# Patient Record
Sex: Female | Born: 1940 | Race: Black or African American | Hispanic: No | Smoking: Former smoker
Health system: Southern US, Community
[De-identification: ages and names within clinical notes are randomized; demographics above are authoritative.]

## PROBLEM LIST (undated history)

## (undated) DIAGNOSIS — I639 Cerebral infarction, unspecified: Secondary | ICD-10-CM

## (undated) DIAGNOSIS — I10 Essential (primary) hypertension: Secondary | ICD-10-CM

## (undated) DIAGNOSIS — H409 Unspecified glaucoma: Secondary | ICD-10-CM

## (undated) HISTORY — PX: CHOLECYSTECTOMY: SHX55

---

## 2004-08-27 ENCOUNTER — Ambulatory Visit: Payer: Self-pay | Admitting: Family Medicine

## 2004-09-03 ENCOUNTER — Ambulatory Visit: Payer: Self-pay | Admitting: Family Medicine

## 2005-10-07 ENCOUNTER — Ambulatory Visit: Payer: Self-pay | Admitting: Family Medicine

## 2007-10-15 ENCOUNTER — Ambulatory Visit: Payer: Self-pay

## 2008-07-11 ENCOUNTER — Ambulatory Visit: Payer: Self-pay

## 2008-09-13 ENCOUNTER — Ambulatory Visit: Payer: Self-pay

## 2008-11-07 ENCOUNTER — Ambulatory Visit: Payer: Self-pay

## 2008-11-29 ENCOUNTER — Ambulatory Visit: Payer: Self-pay

## 2008-12-05 ENCOUNTER — Ambulatory Visit: Payer: Self-pay

## 2008-12-12 ENCOUNTER — Ambulatory Visit: Payer: Self-pay

## 2010-01-01 ENCOUNTER — Ambulatory Visit: Payer: Self-pay | Admitting: Nurse Practitioner

## 2011-01-04 IMAGING — US US EXTREM LOW VENOUS BILAT
1 series · 17 of 24 positions shown · non-contrast
Comparison: none

REASON FOR EXAM: CALLREPORT [id number]  bilateral swelling and pitting
edema  eval DVT
COMMENTS:

[Series 1: us extrem low venous bilat · 17 of 54 slices shown]
[im 1/54]
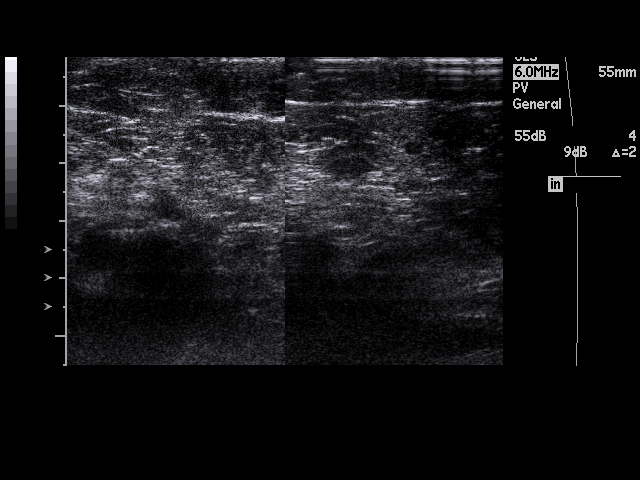
[im 5/54]
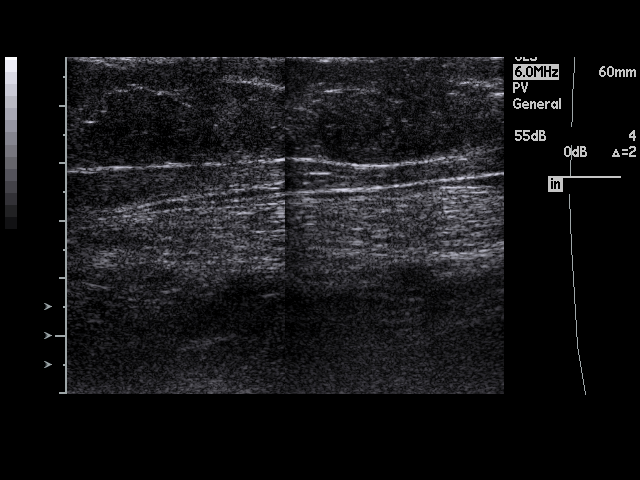
[im 7/54]
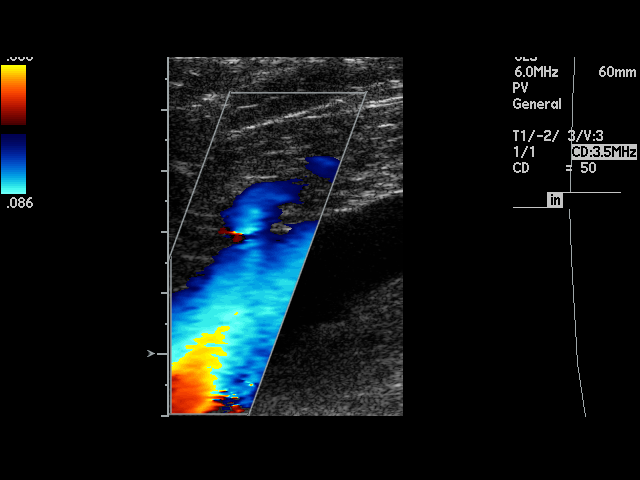
[im 10/54]
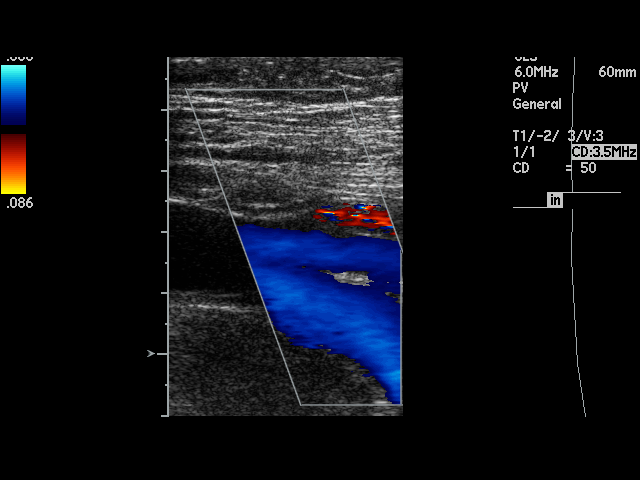
[im 14/54]
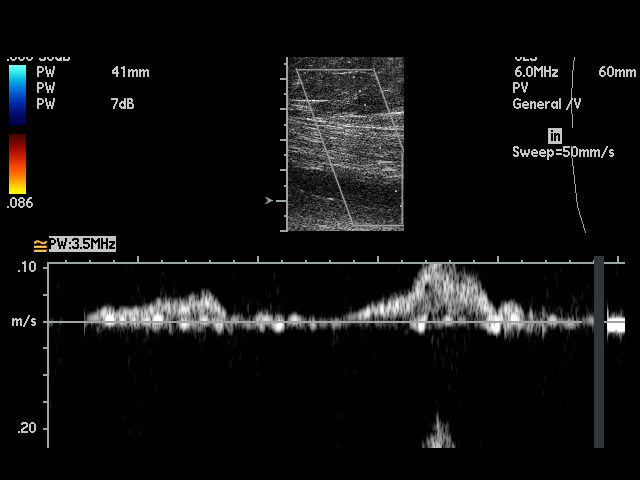
[im 17/54]
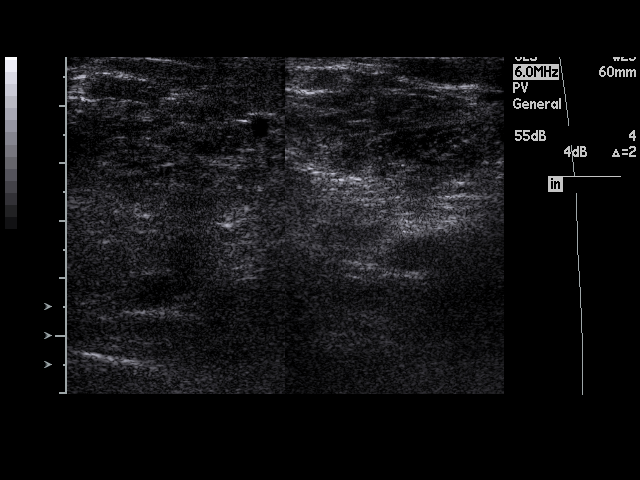
[im 21/54]
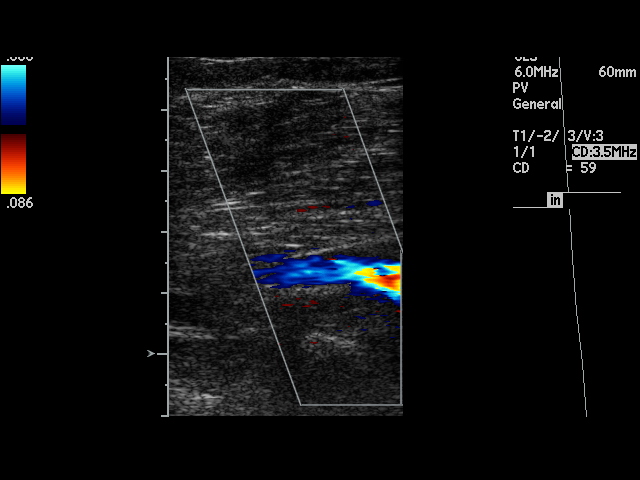
[im 24/54]
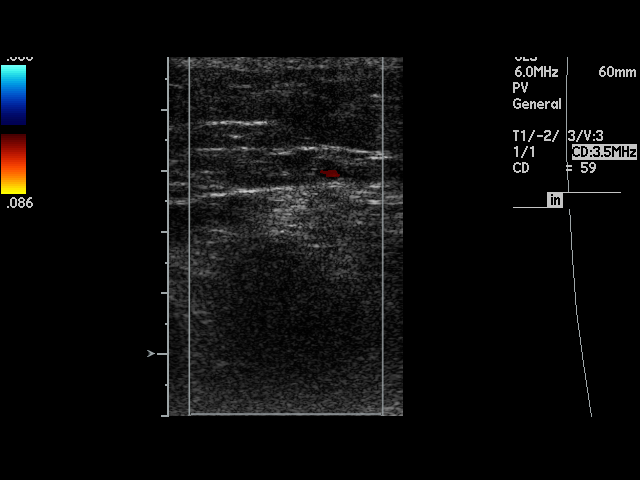
[im 28/54]
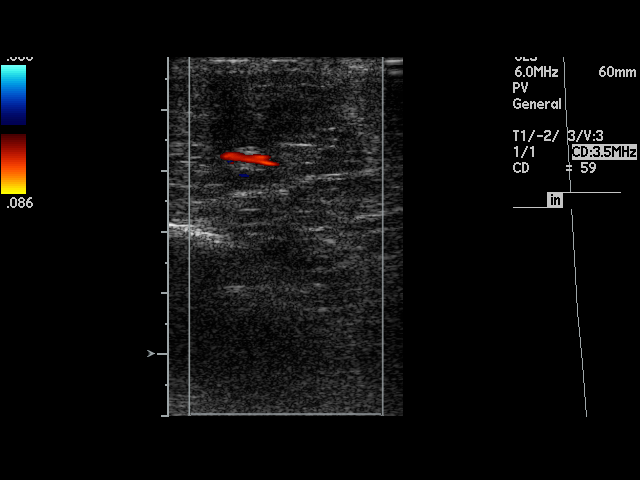
[im 30/54]
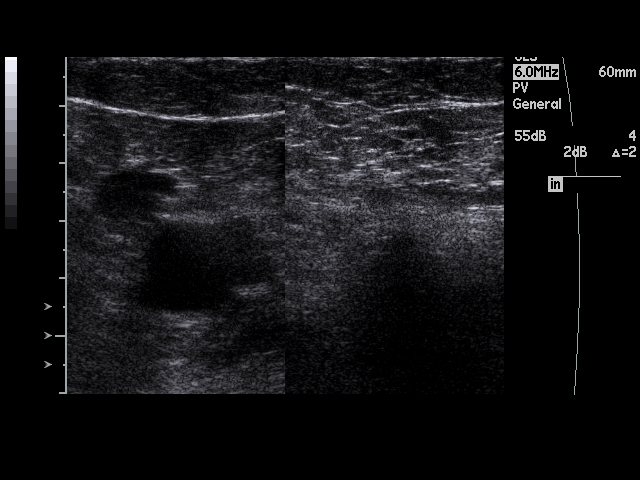
[im 33/54]
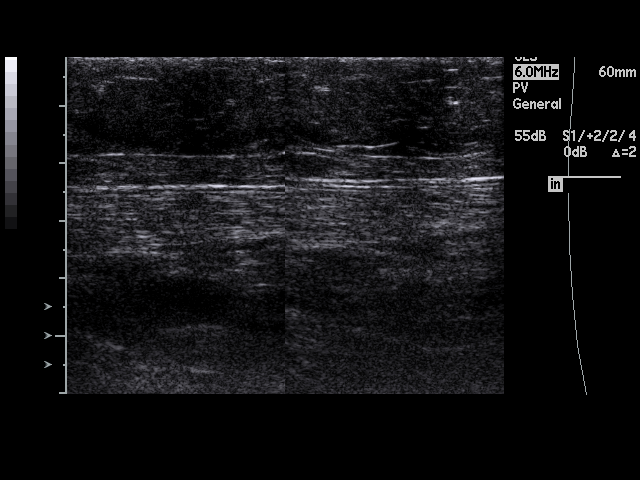
[im 37/54]
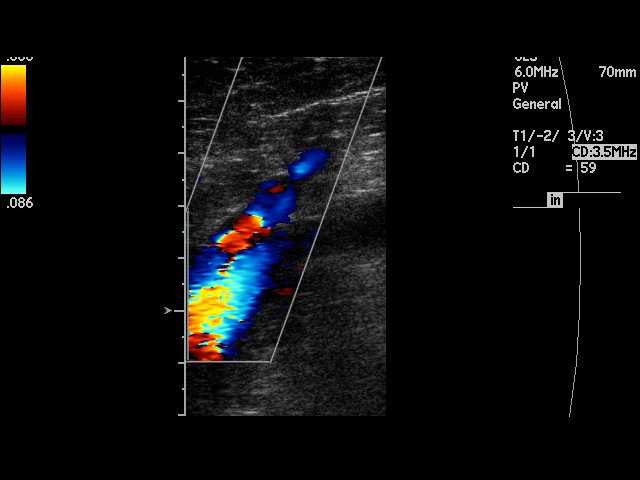
[im 40/54]
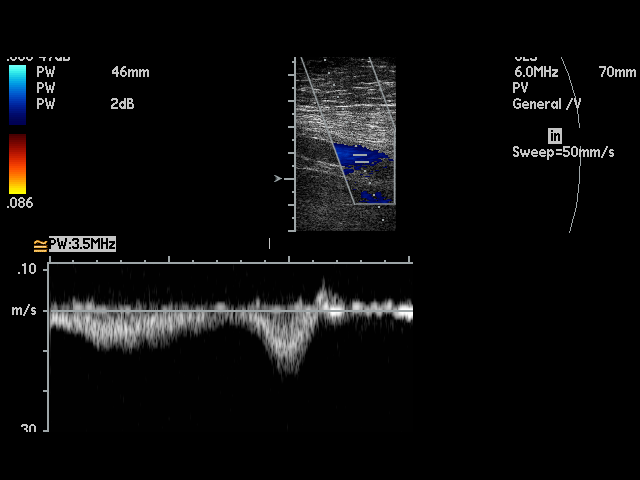
[im 44/54]
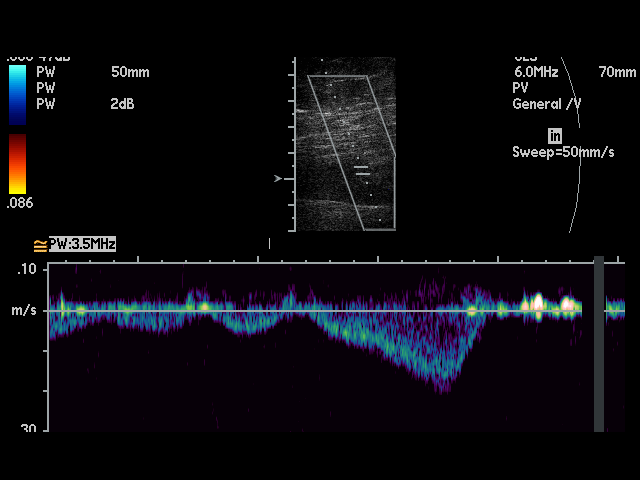
[im 47/54]
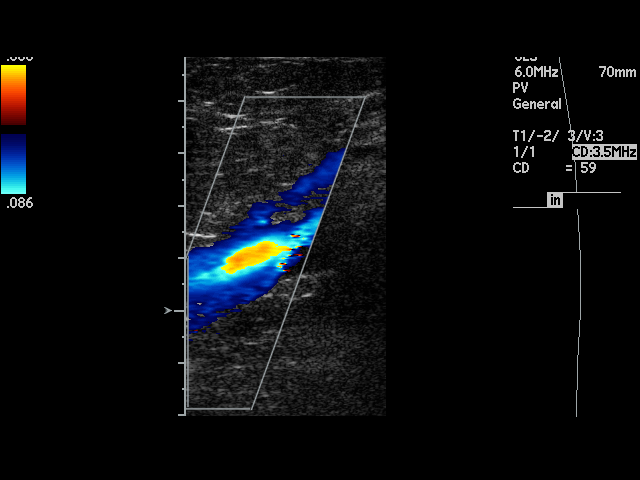
[im 49/54]
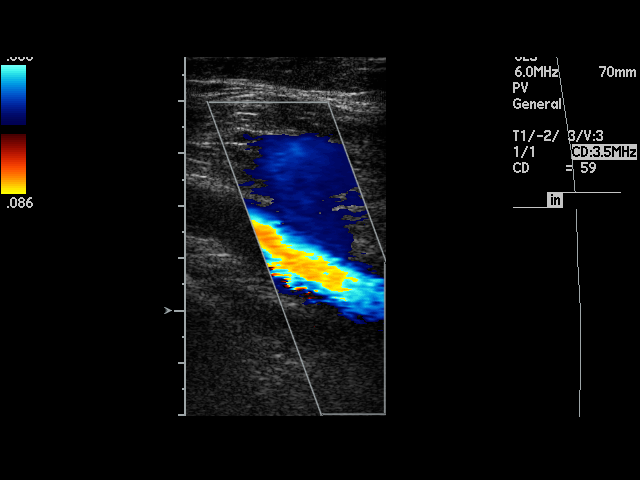
[im 54/54]
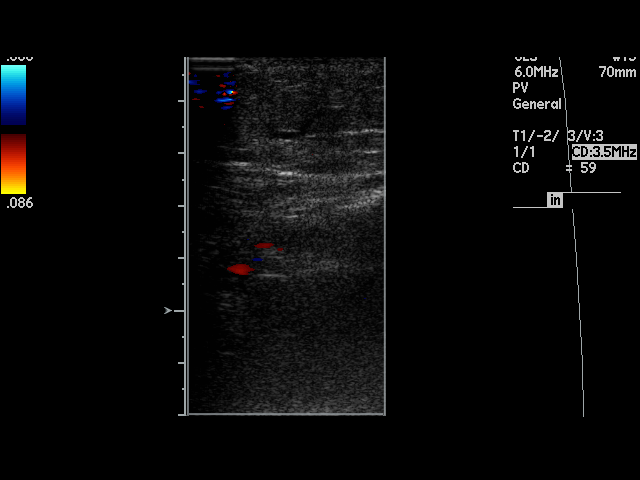

[17 of 24 positions shown; findings below may reference images not displayed]

PROCEDURE:     US  - US DOPPLER LOW EXTR BILATERAL  - December 05, 2008  [DATE]

RESULT:     Duplex Doppler interrogation of the deep venous system of both
legs from the inguinal to the popliteal region demonstrates the deep venous
systems are fully compressible throughout. The color Doppler and spectral
Doppler appearance is normal. There is normal response to distal
augmentation. The color Doppler images show no filling defect.
IMPRESSION: 1. No evidence of DVT in either lower extremity.

## 2012-12-23 ENCOUNTER — Ambulatory Visit: Payer: Self-pay | Admitting: Nurse Practitioner

## 2014-06-07 ENCOUNTER — Ambulatory Visit: Payer: Self-pay | Admitting: Nurse Practitioner

## 2015-02-27 ENCOUNTER — Other Ambulatory Visit: Payer: Self-pay | Admitting: Pain Medicine

## 2020-10-04 ENCOUNTER — Encounter: Payer: Self-pay | Admitting: Emergency Medicine

## 2020-10-04 ENCOUNTER — Other Ambulatory Visit: Payer: Self-pay

## 2020-10-04 DIAGNOSIS — R112 Nausea with vomiting, unspecified: Secondary | ICD-10-CM | POA: Diagnosis not present

## 2020-10-04 DIAGNOSIS — M109 Gout, unspecified: Secondary | ICD-10-CM | POA: Diagnosis not present

## 2020-10-04 DIAGNOSIS — Z5321 Procedure and treatment not carried out due to patient leaving prior to being seen by health care provider: Secondary | ICD-10-CM | POA: Insufficient documentation

## 2020-10-04 LAB — COMPREHENSIVE METABOLIC PANEL
ALT: 13 U/L (ref 0–44)
AST: 17 U/L (ref 15–41)
Albumin: 3.7 g/dL (ref 3.5–5.0)
Alkaline Phosphatase: 90 U/L (ref 38–126)
Anion gap: 12 (ref 5–15)
BUN: 38 mg/dL — ABNORMAL HIGH (ref 8–23)
CO2: 17 mmol/L — ABNORMAL LOW (ref 22–32)
Calcium: 8.9 mg/dL (ref 8.9–10.3)
Chloride: 110 mmol/L (ref 98–111)
Creatinine, Ser: 1.54 mg/dL — ABNORMAL HIGH (ref 0.44–1.00)
GFR, Estimated: 34 mL/min — ABNORMAL LOW (ref >60)
Glucose, Bld: 147 mg/dL — ABNORMAL HIGH (ref 70–99)
Potassium: 4.2 mmol/L (ref 3.5–5.1)
Sodium: 139 mmol/L (ref 135–145)
Total Bilirubin: 0.8 mg/dL (ref 0.3–1.2)
Total Protein: 7.6 g/dL (ref 6.5–8.1)

## 2020-10-04 LAB — CBC
HCT: 33.6 % — ABNORMAL LOW (ref 36.0–46.0)
Hemoglobin: 10.5 g/dL — ABNORMAL LOW (ref 12.0–15.0)
MCH: 30.1 pg (ref 26.0–34.0)
MCHC: 31.3 g/dL (ref 30.0–36.0)
MCV: 96.3 fL (ref 80.0–100.0)
Platelets: 217 10*3/uL (ref 150–400)
RBC: 3.49 MIL/uL — ABNORMAL LOW (ref 3.87–5.11)
RDW: 14.8 % (ref 11.5–15.5)
WBC: 12.2 10*3/uL — ABNORMAL HIGH (ref 4.0–10.5)
nRBC: 0 % (ref 0.0–0.2)

## 2020-10-04 LAB — LIPASE, BLOOD: Lipase: 25 U/L (ref 11–51)

## 2020-10-04 NOTE — ED Triage Notes (Signed)
EMS brings pt in from home for "gout pain" and now with N/V

## 2020-10-04 NOTE — ED Triage Notes (Signed)
Pt to ED from home c/o n/v that started this evening, woke up this morning as well with foot pain felt similar to gout pain.  Daughter with patient states patient had similar symptoms before with a gout flare up and n/v and also concerned for possible UTI.  Pt A&Ox4, chest rise even and unlabored, in NAD at this time.

## 2020-10-05 ENCOUNTER — Emergency Department
Admission: EM | Admit: 2020-10-05 | Discharge: 2020-10-05 | Disposition: A | Discharge status: Home / Self Care | Payer: Medicare (Managed Care) | Attending: Emergency Medicine | Admitting: Emergency Medicine

## 2020-10-05 HISTORY — DX: Essential (primary) hypertension: I10

## 2020-10-05 HISTORY — DX: Unspecified glaucoma: H40.9

## 2020-10-05 HISTORY — DX: Cerebral infarction, unspecified: I63.9

## 2022-06-20 ENCOUNTER — Other Ambulatory Visit: Payer: Self-pay

## 2022-06-20 ENCOUNTER — Emergency Department (HOSPITAL_COMMUNITY): Payer: Medicare (Managed Care)

## 2022-06-20 ENCOUNTER — Observation Stay (HOSPITAL_COMMUNITY)
Admission: EM | Admit: 2022-06-20 | Discharge: 2022-06-23 | Disposition: A | Discharge status: Home Health | Payer: Medicare (Managed Care) | Attending: Family Medicine | Admitting: Family Medicine

## 2022-06-20 ENCOUNTER — Encounter (HOSPITAL_COMMUNITY): Payer: Self-pay | Admitting: Emergency Medicine

## 2022-06-20 DIAGNOSIS — N1832 Chronic kidney disease, stage 3b: Secondary | ICD-10-CM | POA: Diagnosis present

## 2022-06-20 DIAGNOSIS — Z7982 Long term (current) use of aspirin: Secondary | ICD-10-CM | POA: Diagnosis not present

## 2022-06-20 DIAGNOSIS — E860 Dehydration: Secondary | ICD-10-CM | POA: Insufficient documentation

## 2022-06-20 DIAGNOSIS — R739 Hyperglycemia, unspecified: Secondary | ICD-10-CM | POA: Diagnosis not present

## 2022-06-20 DIAGNOSIS — Y92019 Unspecified place in single-family (private) house as the place of occurrence of the external cause: Secondary | ICD-10-CM | POA: Insufficient documentation

## 2022-06-20 DIAGNOSIS — Z8673 Personal history of transient ischemic attack (TIA), and cerebral infarction without residual deficits: Secondary | ICD-10-CM | POA: Diagnosis not present

## 2022-06-20 DIAGNOSIS — I129 Hypertensive chronic kidney disease with stage 1 through stage 4 chronic kidney disease, or unspecified chronic kidney disease: Secondary | ICD-10-CM | POA: Diagnosis not present

## 2022-06-20 DIAGNOSIS — I700 Atherosclerosis of aorta: Secondary | ICD-10-CM | POA: Diagnosis not present

## 2022-06-20 DIAGNOSIS — Z79899 Other long term (current) drug therapy: Secondary | ICD-10-CM | POA: Diagnosis not present

## 2022-06-20 DIAGNOSIS — Z87891 Personal history of nicotine dependence: Secondary | ICD-10-CM | POA: Diagnosis not present

## 2022-06-20 DIAGNOSIS — R109 Unspecified abdominal pain: Secondary | ICD-10-CM | POA: Insufficient documentation

## 2022-06-20 DIAGNOSIS — R531 Weakness: Secondary | ICD-10-CM

## 2022-06-20 DIAGNOSIS — E875 Hyperkalemia: Secondary | ICD-10-CM | POA: Insufficient documentation

## 2022-06-20 DIAGNOSIS — N179 Acute kidney failure, unspecified: Secondary | ICD-10-CM | POA: Diagnosis not present

## 2022-06-20 DIAGNOSIS — R7989 Other specified abnormal findings of blood chemistry: Secondary | ICD-10-CM

## 2022-06-20 DIAGNOSIS — Z20822 Contact with and (suspected) exposure to covid-19: Secondary | ICD-10-CM | POA: Insufficient documentation

## 2022-06-20 DIAGNOSIS — Y92009 Unspecified place in unspecified non-institutional (private) residence as the place of occurrence of the external cause: Secondary | ICD-10-CM

## 2022-06-20 DIAGNOSIS — I639 Cerebral infarction, unspecified: Secondary | ICD-10-CM | POA: Diagnosis present

## 2022-06-20 DIAGNOSIS — W19XXXA Unspecified fall, initial encounter: Secondary | ICD-10-CM | POA: Diagnosis not present

## 2022-06-20 DIAGNOSIS — I2510 Atherosclerotic heart disease of native coronary artery without angina pectoris: Secondary | ICD-10-CM | POA: Diagnosis not present

## 2022-06-20 DIAGNOSIS — I10 Essential (primary) hypertension: Secondary | ICD-10-CM | POA: Diagnosis not present

## 2022-06-20 LAB — URINALYSIS, ROUTINE W REFLEX MICROSCOPIC
Bilirubin Urine: NEGATIVE
Glucose, UA: NEGATIVE mg/dL
Hgb urine dipstick: NEGATIVE
Ketones, ur: NEGATIVE mg/dL
Leukocytes,Ua: NEGATIVE
Nitrite: NEGATIVE
Protein, ur: NEGATIVE mg/dL
Specific Gravity, Urine: 1.011 (ref 1.005–1.030)
pH: 5 (ref 5.0–8.0)

## 2022-06-20 LAB — BASIC METABOLIC PANEL
Anion gap: 12 (ref 5–15)
BUN: 85 mg/dL — ABNORMAL HIGH (ref 8–23)
CO2: 17 mmol/L — ABNORMAL LOW (ref 22–32)
Calcium: 9.5 mg/dL (ref 8.9–10.3)
Chloride: 104 mmol/L (ref 98–111)
Creatinine, Ser: 2.94 mg/dL — ABNORMAL HIGH (ref 0.44–1.00)
GFR, Estimated: 16 mL/min — ABNORMAL LOW (ref >60)
Glucose, Bld: 212 mg/dL — ABNORMAL HIGH (ref 70–99)
Potassium: 5.4 mmol/L — ABNORMAL HIGH (ref 3.5–5.1)
Sodium: 133 mmol/L — ABNORMAL LOW (ref 135–145)

## 2022-06-20 LAB — CBC
HCT: 36.1 % (ref 36.0–46.0)
Hemoglobin: 11.7 g/dL — ABNORMAL LOW (ref 12.0–15.0)
MCH: 31 pg (ref 26.0–34.0)
MCHC: 32.4 g/dL (ref 30.0–36.0)
MCV: 95.8 fL (ref 80.0–100.0)
Platelets: 186 10*3/uL (ref 150–400)
RBC: 3.77 MIL/uL — ABNORMAL LOW (ref 3.87–5.11)
RDW: 15.6 % — ABNORMAL HIGH (ref 11.5–15.5)
WBC: 19.7 10*3/uL — ABNORMAL HIGH (ref 4.0–10.5)
nRBC: 0 % (ref 0.0–0.2)

## 2022-06-20 LAB — LACTIC ACID, PLASMA
Lactic Acid, Venous: 3 mmol/L (ref 0.5–1.9)
Lactic Acid, Venous: 2.6 mmol/L (ref 0.5–1.9)

## 2022-06-20 LAB — SARS CORONAVIRUS 2 BY RT PCR: SARS Coronavirus 2 by RT PCR: NEGATIVE

## 2022-06-20 LAB — CBG MONITORING, ED
Glucose-Capillary: 185 mg/dL — ABNORMAL HIGH (ref 70–99)
Glucose-Capillary: 131 mg/dL — ABNORMAL HIGH (ref 70–99)

## 2022-06-20 MED ORDER — VANCOMYCIN HCL 2000 MG/400ML IV SOLN
2000.0000 mg | Freq: Once | INTRAVENOUS | Status: AC
  Administered 2022-06-20: 2000 mg via INTRAVENOUS
  Filled 2022-06-20: qty 400

## 2022-06-20 MED ORDER — METRONIDAZOLE 500 MG/100ML IV SOLN
500.0000 mg | Freq: Once | INTRAVENOUS | Status: AC
  Administered 2022-06-20: 500 mg via INTRAVENOUS
  Filled 2022-06-20: qty 100

## 2022-06-20 MED ORDER — DORZOLAMIDE HCL 2 % OP SOLN
1.0000 [drp] | Freq: Two times a day (BID) | OPHTHALMIC | Status: DC
  Administered 2022-06-20 – 2022-06-23 (×6): 1 [drp] via OPHTHALMIC
  Filled 2022-06-20 (×2): qty 10

## 2022-06-20 MED ORDER — VANCOMYCIN HCL IN DEXTROSE 1-5 GM/200ML-% IV SOLN: 1000.0000 mg | Freq: Once | INTRAVENOUS | Status: DC

## 2022-06-20 MED ORDER — ACETAMINOPHEN 325 MG PO TABS
650.0000 mg | ORAL_TABLET | Freq: Four times a day (QID) | ORAL | Status: DC | PRN
  Administered 2022-06-23: 650 mg via ORAL
  Filled 2022-06-20: qty 2

## 2022-06-20 MED ORDER — TIMOLOL MALEATE 0.5 % OP SOLN
1.0000 [drp] | Freq: Two times a day (BID) | OPHTHALMIC | Status: DC
  Administered 2022-06-20 – 2022-06-23 (×6): 1 [drp] via OPHTHALMIC
  Filled 2022-06-20: qty 5

## 2022-06-20 MED ORDER — LATANOPROST 0.005 % OP SOLN
1.0000 [drp] | Freq: Every day | OPHTHALMIC | Status: DC
  Administered 2022-06-21 – 2022-06-22 (×2): 1 [drp] via OPHTHALMIC
  Filled 2022-06-20 (×3): qty 2.5

## 2022-06-20 MED ORDER — BRIMONIDINE TARTRATE 0.2 % OP SOLN
1.0000 [drp] | Freq: Two times a day (BID) | OPHTHALMIC | Status: DC
  Administered 2022-06-20 – 2022-06-23 (×6): 1 [drp] via OPHTHALMIC
  Filled 2022-06-20: qty 5

## 2022-06-20 MED ORDER — SODIUM CHLORIDE 0.9 % IV SOLN
2.0000 g | Freq: Once | INTRAVENOUS | Status: AC
  Administered 2022-06-20: 2 g via INTRAVENOUS
  Filled 2022-06-20: qty 12.5

## 2022-06-20 MED ORDER — POLYETHYLENE GLYCOL 3350 17 G PO PACK: 17.0000 g | PACK | Freq: Every day | ORAL | Status: DC | PRN

## 2022-06-20 MED ORDER — ACETAMINOPHEN 650 MG RE SUPP: 650.0000 mg | Freq: Four times a day (QID) | RECTAL | Status: DC | PRN

## 2022-06-20 MED ORDER — SODIUM CHLORIDE 0.9 % IV BOLUS
500.0000 mL | Freq: Once | INTRAVENOUS | Status: AC
  Administered 2022-06-20: 500 mL via INTRAVENOUS

## 2022-06-20 MED ORDER — SODIUM CHLORIDE 0.9 % IV SOLN: INTRAVENOUS | Status: AC

## 2022-06-20 MED ORDER — SODIUM CHLORIDE 0.9 % IV SOLN: 2.0000 g | INTRAVENOUS | Status: DC

## 2022-06-20 MED ORDER — ATORVASTATIN CALCIUM 40 MG PO TABS
80.0000 mg | ORAL_TABLET | Freq: Every day | ORAL | Status: DC
  Administered 2022-06-20 – 2022-06-23 (×4): 80 mg via ORAL
  Filled 2022-06-20 (×4): qty 2

## 2022-06-20 MED ORDER — BARIUM SULFATE 2 % PO SUSP
ORAL | Status: AC
  Filled 2022-06-20: qty 2

## 2022-06-20 MED ORDER — VANCOMYCIN HCL 1500 MG/300ML IV SOLN: 1500.0000 mg | INTRAVENOUS | Status: DC

## 2022-06-20 MED ORDER — ONDANSETRON HCL 4 MG/2ML IJ SOLN: 4.0000 mg | Freq: Four times a day (QID) | INTRAMUSCULAR | Status: DC | PRN

## 2022-06-20 MED ORDER — BRIMONIDINE TARTRATE-TIMOLOL 0.2-0.5 % OP SOLN: 1.0000 [drp] | Freq: Two times a day (BID) | OPHTHALMIC | Status: DC

## 2022-06-20 MED ORDER — HEPARIN SODIUM (PORCINE) 5000 UNIT/ML IJ SOLN
5000.0000 [IU] | Freq: Three times a day (TID) | INTRAMUSCULAR | Status: DC
  Administered 2022-06-20 – 2022-06-23 (×8): 5000 [IU] via SUBCUTANEOUS
  Filled 2022-06-20 (×9): qty 1

## 2022-06-20 MED ORDER — ALLOPURINOL 100 MG PO TABS
100.0000 mg | ORAL_TABLET | Freq: Every day | ORAL | Status: DC
  Administered 2022-06-20 – 2022-06-23 (×4): 100 mg via ORAL
  Filled 2022-06-20 (×4): qty 1

## 2022-06-20 MED ORDER — ONDANSETRON HCL 4 MG PO TABS: 4.0000 mg | ORAL_TABLET | Freq: Four times a day (QID) | ORAL | Status: DC | PRN

## 2022-06-20 MED ORDER — NETARSUDIL DIMESYLATE 0.02 % OP SOLN
1.0000 [drp] | Freq: Every day | OPHTHALMIC | Status: DC
  Administered 2022-06-21 – 2022-06-22 (×2): 1 [drp] via OPHTHALMIC
  Filled 2022-06-20: qty 1

## 2022-06-20 MED ORDER — HYDRALAZINE HCL 25 MG PO TABS
100.0000 mg | ORAL_TABLET | Freq: Two times a day (BID) | ORAL | Status: DC
  Administered 2022-06-20 – 2022-06-23 (×6): 100 mg via ORAL
  Filled 2022-06-20 (×6): qty 4

## 2022-06-20 MED ORDER — ASPIRIN 81 MG PO CHEW
81.0000 mg | CHEWABLE_TABLET | Freq: Every day | ORAL | Status: DC
  Administered 2022-06-20 – 2022-06-23 (×4): 81 mg via ORAL
  Filled 2022-06-20 (×4): qty 1

## 2022-06-20 NOTE — Assessment & Plan Note (Signed)
Glucose 212. -Check A1c

## 2022-06-20 NOTE — ED Notes (Signed)
Pt blood pressure trending upwards. Daughter says pt has not taken blood pressure medication today. MD made aware

## 2022-06-20 NOTE — ED Notes (Signed)
Pt repositioned and bed linens changed.

## 2022-06-20 NOTE — Assessment & Plan Note (Addendum)
AKI and CKD stage IIIb.  Creatinine elevated at 2.94.  Baseline over the past year has ranged from 1.2-1.5.  Likely prerenal from low-dose diuretic torsemide, in the setting of ARB.  She denies GI losses. -1.5 L bolus given, continue N/s 75cc/hr x 20hrs -Hold home torsemide, losartan, spironolactone

## 2022-06-20 NOTE — ED Provider Notes (Addendum)
[facility] EMERGENCY DEPARTMENT Provider Note   CSN: [id number] Arrival date & time: 06/20/22  7408     History  Chief Complaint  Patient presents with   Weakness    Vanessa Davenport is a 81 y.o. female.  Pt with generalized weakness, pt went to bathroom, had bm, and then felt generally weak and lightheaded/faint. Pt denies focal or unilateral numbness or weakness. No change in speech or vision. Denies injury. Unclear if brief loc, syncope vs near syncope. Denies any current or recent chest pain or discomfort. No palpitations. No sob or unusual doe. No headache. Pt relatively limited historian - level 5 caveat. Pt denies dysuria or gu c/o. No abd pain or nvd. Denies melena or rectal bleeding. Pt unsure if took meds yet today. Indicates has not eaten/drank yet today.   The history is provided by the patient, medical records and the EMS personnel. The history is limited by the condition of the patient.  Weakness Associated symptoms: no abdominal pain, no chest pain, no cough, no diarrhea, no dysuria, no fever, no headaches, no shortness of breath and no vomiting        Home Medications Prior to Admission medications   Not on File      Allergies    Patient has no known allergies.    Review of Systems   Review of Systems  Constitutional:  Negative for chills and fever.  HENT:  Negative for sore throat.   Eyes:  Negative for visual disturbance.  Respiratory:  Negative for cough and shortness of breath.   Cardiovascular:  Negative for chest pain and palpitations.  Gastrointestinal:  Negative for abdominal pain, blood in stool, diarrhea and vomiting.  Genitourinary:  Negative for dysuria and flank pain.  Musculoskeletal:  Negative for back pain and neck pain.  Skin:  Negative for rash.  Neurological:  Positive for weakness and light-headedness. Negative for headaches.  Hematological:  Does not bruise/bleed easily.    Physical Exam Updated Vital Signs Pulse 71   Temp  (!) 96 F (35.6 C) (Rectal)   Resp 19   Ht 1.575 m (5\' 2" )   Wt 115 kg   SpO2 95%   BMI 46.37 kg/m  Physical Exam Vitals and nursing note reviewed.  Constitutional:      Appearance: Normal appearance. She is well-developed.  HENT:     Head: Atraumatic.     Nose: Nose normal.     Mouth/Throat:     Mouth: Mucous membranes are moist.  Eyes:     General: No scleral icterus.    Conjunctiva/sclera: Conjunctivae normal.     Pupils: Pupils are equal, round, and reactive to light.  Neck:     Vascular: No carotid bruit.     Trachea: No tracheal deviation.  Cardiovascular:     Rate and Rhythm: Normal rate and regular rhythm.     Pulses: Normal pulses.     Heart sounds: Normal heart sounds. No murmur heard.    No friction rub. No gallop.  Pulmonary:     Effort: Pulmonary effort is normal. No respiratory distress.     Breath sounds: Normal breath sounds.  Abdominal:     General: Bowel sounds are normal. There is no distension.     Palpations: Abdomen is soft. There is no mass.     Tenderness: There is no abdominal tenderness. There is no guarding.  Genitourinary:    Comments: No cva tenderness.  Musculoskeletal:        General:  No swelling or tenderness.     Cervical back: Normal range of motion and neck supple. No rigidity or tenderness. No muscular tenderness.     Right lower leg: No edema.     Left lower leg: No edema.     Comments: CTLS spine, non tender, aligned, no step off. Good rom extremities without pain or focal bony tenderness. No focal extremity pain, swelling, cellulitis, or ulcer.   Skin:    General: Skin is warm and dry.     Findings: No rash.  Neurological:     Mental Status: She is alert.     Comments: Alert, speech normal. Motor/sens grossly intact bil. Stre 5/5. No pronator drift.   Psychiatric:        Mood and Affect: Mood normal.     ED Results / Procedures / Treatments   Labs (all labs ordered are listed, but only abnormal results are  displayed) Results for orders placed or performed during the hospital encounter of 06/20/22  SARS Coronavirus 2 by RT PCR (hospital order, performed in [facility] hospital lab) *cepheid single result test* Anterior Nasal Swab   Specimen: Anterior Nasal Swab  Result Value Ref Range   SARS Coronavirus 2 by RT PCR NEGATIVE NEGATIVE  Basic metabolic panel  Result Value Ref Range   Sodium 133 (L) 135 - 145 mmol/L   Potassium 5.4 (H) 3.5 - 5.1 mmol/L   Chloride 104 98 - 111 mmol/L   CO2 17 (L) 22 - 32 mmol/L   Glucose, Bld 212 (H) 70 - 99 mg/dL   BUN 85 (H) 8 - 23 mg/dL   Creatinine, Ser 4.40 (H) 0.44 - 1.00 mg/dL   Calcium 9.5 8.9 - 34.7 mg/dL   GFR, Estimated 16 (L) >60 mL/min   Anion gap 12 5 - 15  CBC  Result Value Ref Range   WBC 19.7 (H) 4.0 - 10.5 K/uL   RBC 3.77 (L) 3.87 - 5.11 MIL/uL   Hemoglobin 11.7 (L) 12.0 - 15.0 g/dL   HCT 42.5 95.6 - 38.7 %   MCV 95.8 80.0 - 100.0 fL   MCH 31.0 26.0 - 34.0 pg   MCHC 32.4 30.0 - 36.0 g/dL   RDW 56.4 (H) 33.2 - 95.1 %   Platelets 186 150 - 400 K/uL   nRBC 0.0 0.0 - 0.2 %  Urinalysis, Routine w reflex microscopic Urine, In & Out Cath  Result Value Ref Range   Color, Urine YELLOW YELLOW   APPearance CLOUDY (A) CLEAR   Specific Gravity, Urine 1.011 1.005 - 1.030   pH 5.0 5.0 - 8.0   Glucose, UA NEGATIVE NEGATIVE mg/dL   Hgb urine dipstick NEGATIVE NEGATIVE   Bilirubin Urine NEGATIVE NEGATIVE   Ketones, ur NEGATIVE NEGATIVE mg/dL   Protein, ur NEGATIVE NEGATIVE mg/dL   Nitrite NEGATIVE NEGATIVE   Leukocytes,Ua NEGATIVE NEGATIVE  Lactic acid, plasma  Result Value Ref Range   Lactic Acid, Venous 3.0 (HH) 0.5 - 1.9 mmol/L  CBG monitoring, ED  Result Value Ref Range   Glucose-Capillary 185 (H) 70 - 99 mg/dL  CBG monitoring, ED  Result Value Ref Range   Glucose-Capillary 131 (H) 70 - 99 mg/dL      EKG EKG Interpretation  Date/Time:  Thursday June 20 2022 15:27:44 EDT Ventricular Rate:  93 PR Interval:  184 QRS  Duration: 85 QT Interval:  339 QTC Calculation: 422 R Axis:   18 Text Interpretation: Sinus rhythm Confirmed by Cathren Laine ([id number]) on 06/20/2022 3:34:08 PM  Radiology DG Chest Port 1 View  Result Date: 06/20/2022 CLINICAL DATA:  Weakness. EXAM: PORTABLE CHEST 1 VIEW COMPARISON:  Chest x-ray July 11, 2008. FINDINGS: Patient rotation. No consolidation. No visible pleural effusions or pneumothorax. Cardiomediastinal silhouette is unchanged. IMPRESSION: No evidence of acute cardiopulmonary disease. Electronically Signed   By: Feliberto Harts M.D.   On: 06/20/2022 09:46    Procedures Procedures    Medications Ordered in ED Medications - No data to display  ED Course/ Medical Decision Making/ A&P                           Medical Decision Making Problems Addressed: AKI (acute kidney injury) [facility] acute illness or injury that poses a threat to life or bodily functions Dehydration: acute illness or injury with systemic symptoms that poses a threat to life or bodily functions Elevated lactic acid level: acute illness or injury that poses a threat to life or bodily functions Generalized weakness: acute illness or injury with systemic symptoms  Amount and/or Complexity of Data Reviewed Independent Historian: EMS    Details: hx External Data Reviewed: labs and notes. Labs: ordered. Decision-making details documented in ED Course. Radiology: ordered and independent interpretation performed. Decision-making details documented in ED Course. ECG/medicine tests: ordered and independent interpretation performed. Decision-making details documented in ED Course. Discussion of management or test interpretation with external provider(s): Hospitalists, discussed pt.   Risk Prescription drug management. Decision regarding hospitalization.   Iv ns. Continuous pulse ox and cardiac monitoring. Labs ordered/sent. Imaging ordered.   Reviewed nursing notes and prior charts for additional  history. External reports reviewed. Additional history from:  Cardiac monitor: sinus rhythm, rate 70.  Labs reviewed/interpreted by me - covid neg, no uti. +AKI - ns boluses.   CT reviewed/interpreted by me - no hem.  Cxr reviewed/interpreted by me - no pna.   Given syncope, weakness, aki - will consult hospitalists for admission. Discussed pt, ct pending - will admit.   Repeat lactate pending.  Pt denies fever/chills, no obvious source of infection on exam and re-exam - abd is mildly diffusely tender on repeat, so will get ct imaging r/o infectious process.   CRITICAL CARE RE Gen weakness with aki/dehydration, elevated lactic acid level Performed by: Suzi Roots Total critical care time:40 minutes Critical care time was exclusive of separately billable procedures and treating other patients. Critical care was necessary to treat or prevent imminent or life-threatening deterioration. Critical care was time spent personally by me on the following activities: development of treatment plan with patient and/or surrogate as well as nursing, discussions with consultants, evaluation of patient's response to treatment, examination of patient, obtaining history from patient or surrogate, ordering and performing treatments and interventions, ordering and review of laboratory studies, ordering and review of radiographic studies, pulse oximetry and re-evaluation of patient's condition.          Final Clinical Impression(s) / ED Diagnoses Final diagnoses:  None    Rx / DC Orders ED Discharge Orders     None          Cathren Laine, MD 06/20/22 1554

## 2022-06-20 NOTE — ED Notes (Signed)
Spoke with [facility] staff reported if pt needs help after discharge they may be reached at [phone]

## 2022-06-20 NOTE — ED Notes (Signed)
Pt used bedside toilet; stool was yellow and watery; reported to nurse

## 2022-06-20 NOTE — ED Notes (Signed)
Pt family requested for fluids to be discontinued. Educated pt on need for fluid but family felt that fluids was causing shift in blood pressure.

## 2022-06-20 NOTE — ED Notes (Signed)
Manuel pressure taken by NT. Pressure 134/78 manually

## 2022-06-20 NOTE — ED Notes (Addendum)
Pt and family requested for Vancomycin to be stopped. Pt started to feel jittery shortly after medication and blood pressure increased. MD made aware

## 2022-06-20 NOTE — Assessment & Plan Note (Addendum)
Generalized weakness, dizziness and fall in the bathroom.  Head CT unremarkable.  Fall likely secondary to dizziness and dehydration.  With elevated creatinine and lactic acidosis of 3 > 2.6.  Chest x-ray, UA, CT abdomen and pelvis negative for infectious etiology or other etiology.  Leukocytosis of 19.7 unexplained.  Not meeting sepsis criteria. -Cefepime given in ED, vancomycin was started, but stopped due to (received just 107mls of 400 mils) complaints of jitteriness and increased blood pressure.   -Hold off on further antibiotics at this time -Follow-up blood cultures -Orthostatic vitals -Trend WBC

## 2022-06-20 NOTE — ED Triage Notes (Signed)
[facility]- initially called out for fall off toilet but pt was too weak to get up and too weak to ambulate. Pt normally walks with walker. Denies any pain. Glucose was 280 by EMS and pt does not have diabetes. Pt states she just started feeling weak when she she went to the bathroom this morning and thinks she had a BM but is not positive.

## 2022-06-20 NOTE — Progress Notes (Signed)
Pharmacy Antibiotic Note  Vanessa Davenport a 81 y.o. female admitted on 06/20/2022 with sepsis.  Pharmacy has been consulted for vancomycin and cefepime dosing.  Plan: Vancomycin 1500 IV every 48 hours.  Goal trough 15-20 mcg/mL. Cefepime 2gm IV every 24 hours.  Medical History: Past Medical History:  Diagnosis Date   Glaucoma    Hypertension    Stroke [facility])     Allergies:  No Known Allergies  Filed Weights   06/20/22 0856  Weight: 115 kg (253 lb 8.5 oz)       Latest Ref Rng & Units 06/20/2022    8:59 AM 10/04/2020    9:55 PM  CBC  WBC 4.0 - 10.5 K/uL 19.7  12.2   Hemoglobin 12.0 - 15.0 g/dL 11.7  10.5   Hematocrit 36.0 - 46.0 % 36.1  33.6   Platelets 150 - 400 K/uL 186  217      Estimated Creatinine Clearance: 18 mL/min (A) (by C-G formula based on SCr of 2.94 mg/dL (H)).  Antibiotics Given (last 72 hours)     None       Antimicrobials this admission:  Cefepime 06/20/2022  >>  vancomycin 06/20/2022  >>  Metronidazole 06/20/2022   x 1   Microbiology results: 06/20/2022  BCx: sent 06/20/2022  Resp Panel: sent   Thank you for allowing pharmacy to be a part of this patient's care.  Thomasenia Sales, PharmD Clinical Pharmacist

## 2022-06-20 NOTE — Assessment & Plan Note (Signed)
Systolic 916B to 846K. -Resume hydralazine,

## 2022-06-20 NOTE — ED Notes (Signed)
MD at bedside. 

## 2022-06-20 NOTE — Assessment & Plan Note (Signed)
No significant focal deficits appreciated at this time. -Resume aspirin, statins

## 2022-06-20 NOTE — H&P (Signed)
History and Physical    Vanessa Davenport [id number] DOB: [birth year] DOA: 06/20/2022  PCP: Gwynne Edinger, MD   Patient coming from: Home  I have personally briefly reviewed patient's old medical records in [place]  Chief Complaint: Weakness  HPI: Vanessa Davenport is a 81 y.o. female with medical history significant for hypertension, glaucoma, stroke. Patient presented to the ED with complaints of generalized weakness unable to stand this morning.  Upon to today she has been doing well.  Appetite has been good, no vomiting no loose stools.  She reports dizziness this morning, and subsequently falling in the bathroom.  She did not lose consciousness..  Weakness.  Patient's son stays with her.  Patient's daughter Angela Nevin and son-in-law at bedside. Patient denies chest pain, denies pain with urination, no fever no chills, no difficulty breathing, no cough.  No abdominal pain.  She is on torsemide 10 mg daily, daughter reports this dose was recently increased from 5 to 10mg .  ED Course: Temperature 97.7.  Heart rate 70s to 80s.  Respiratory rate 80 15-21.  Blood pressure systolic 1 [id number].  O2 sats greater than 95% on room air. Leukocytosis of 19.5.  Lactic acidosis of 3.  Sodium of 133.  Creatinine elevated 2.94.  COVID test negative.  UA not suggestive of infection.  Chest x-ray clear. CT abdomen pelvis without acute abnormality. Broad-spectrum antibiotics started, family became concerned after the vancomycin was started, patient became jittery, and her blood pressure went up.  They are also concerned about the fluids patient was getting. Hospitalist admit for AKI, and possible infection.  Review of Systems: As per HPI all other systems reviewed and negative.  Past Medical History:  Diagnosis Date   Glaucoma    Hypertension    Stroke [facility])     Past Surgical History:  Procedure Laterality Date   CHOLECYSTECTOMY       reports that she has quit smoking. She has  never used smokeless tobacco. She reports that she does not drink alcohol and does not use drugs.  Allergies  Allergen Reactions   Prednisolone Swelling   Quinapril Swelling   Amlodipine    Thiazide-Type Diuretics     Per PCP notes it's a severe reaction    Carvedilol Nausea And Vomiting   Prednisone Rash   Prior to Admission medications   Medication Sig Start Date End Date Taking? Authorizing Provider  allopurinol (ZYLOPRIM) 100 MG tablet Take 100 mg by mouth daily. 04/12/22  Yes [provider]  aspirin 81 MG chewable tablet Chew 81 mg by mouth daily.   Yes [provider]  atorvastatin (LIPITOR) 80 MG tablet Take 80 mg by mouth daily. 04/12/22  Yes [provider]  COMBIGAN 0.2-0.5 % ophthalmic solution Apply 1 drop to eye 2 (two) times daily. 05/20/22  Yes [provider]  dorzolamide (TRUSOPT) 2 % ophthalmic solution 1 drop 2 (two) times daily. 05/10/22  Yes [provider]  hydrALAZINE (APRESOLINE) 100 MG tablet Take 100 mg by mouth in the morning and at bedtime. 06/10/22  Yes [provider]  losartan (COZAAR) 100 MG tablet Take 100 mg by mouth daily. 04/12/22  Yes [provider]  LUMIGAN 0.01 % SOLN Place 1 drop into both eyes at bedtime. 04/12/22  Yes [provider]  RHOPRESSA 0.02 % SOLN Apply 1 drop to eye at bedtime. 05/20/22  Yes [provider]  spironolactone (ALDACTONE) 25 MG tablet Take 25 mg by mouth daily. 04/12/22  Yes  [provider]  torsemide (DEMADEX) 10 MG tablet Take 10 mg by mouth daily. 05/20/22  Yes [provider]  VITAMIN D PO Take 1 tablet by mouth daily.   Yes [provider]    Physical Exam: Vitals:   06/20/22 1258 06/20/22 1300 06/20/22 1646 06/20/22 1800  BP: (!) 165/52 (!) 149/57 134/78 (!) 129/49  Pulse:  84  88  Resp: (!) 21 18  18   Temp: 97.7 F (36.5 C)     TempSrc: Oral     SpO2: 99% 99%  100%  Weight:      Height:         Constitutional: NAD, calm, comfortable Vitals:   06/20/22 1258 06/20/22 1300 06/20/22 1646 06/20/22 1800  BP: (!) 165/52 (!) 149/57 134/78 (!) 129/49  Pulse:  84  88  Resp: (!) 21 18  18   Temp: 97.7 F (36.5 C)     TempSrc: Oral     SpO2: 99% 99%  100%  Weight:      Height:       Eyes: PERRL, lids and conjunctivae normal ENMT: Mucous membranes are moist.   Neck: normal, supple, no masses, no thyromegaly Respiratory: clear to auscultation bilaterally, no wheezing, no crackles. Normal respiratory effort. No accessory muscle use.  Cardiovascular: Regular rate and rhythm, no murmurs / rubs / gallops. No extremity edema.  Abdomen: no tenderness, no masses palpated. No hepatosplenomegaly. Bowel sounds positive.  Musculoskeletal: no clubbing / cyanosis. No joint deformity upper and lower extremities. Skin: no rashes, lesions, ulcers. No induration Neurologic: Speech fluent without evidence of aphasia, no facial asymmetry, moving all extremities spontaneously.  Psychiatric: Normal judgment and insight. Alert and oriented x 3. Normal mood.   Labs on Admission: I have personally reviewed following labs and imaging studies  CBC: Recent Labs  Lab 06/20/22 0859  WBC 19.7*  HGB 11.7*  HCT 36.1  MCV 95.8  PLT 229   Basic Metabolic Panel: Recent Labs  Lab 06/20/22 0859  NA 133*  K 5.4*  CL 104  CO2 17*  GLUCOSE 212*  BUN 85*  CREATININE 2.94*  CALCIUM 9.5   CBG: Recent Labs  Lab 06/20/22 0938 06/20/22 1435  GLUCAP 185* 131*   Urine analysis:    Component Value Date/Time   COLORURINE YELLOW 06/20/2022 0859   APPEARANCEUR CLOUDY (A) 06/20/2022 0859   LABSPEC 1.011 06/20/2022 0859   PHURINE 5.0 06/20/2022 0859   GLUCOSEU NEGATIVE 06/20/2022 0859   HGBUR NEGATIVE 06/20/2022 0859   BILIRUBINUR NEGATIVE 06/20/2022 0859   KETONESUR NEGATIVE 06/20/2022 0859   PROTEINUR NEGATIVE 06/20/2022 0859   NITRITE NEGATIVE 06/20/2022 0859   LEUKOCYTESUR NEGATIVE 06/20/2022  0859    Radiological Exams on Admission: CT Abdomen Pelvis Wo Contrast  Result Date: 06/20/2022 CLINICAL DATA:  Acute abdominal pain EXAM: CT ABDOMEN AND PELVIS WITHOUT CONTRAST TECHNIQUE: Multidetector CT imaging of the abdomen and pelvis was performed following the standard protocol without IV contrast. RADIATION DOSE REDUCTION: This exam was performed according to the departmental dose-optimization program which includes automated exposure control, adjustment of the mA and/or kV according to patient size and/or use of iterative reconstruction technique. COMPARISON:  None Available. FINDINGS: Lower chest: Coronary calcifications. No pleural or pericardial effusion. Visualized lung bases clear. Hepatobiliary: No focal liver lesion or biliary ductal dilatation. Gallbladder decompressed or absent. Pancreas: Unremarkable. No pancreatic ductal dilatation or surrounding inflammatory changes. Spleen: Normal in size without focal abnormality. Adrenals/Urinary Tract: No adrenal mass. No urolithiasis or hydronephrosis. 2.2 cm 1  HU mid left renal parapelvic cyst; no follow-up recommended. The urinary bladder incompletely distended. Stomach/Bowel: Stomach is partially distended, with small hiatal hernia. Small bowel is relatively decompressed with good distal passage of oral contrast material. Normal appendix. The colon is incompletely distended, unremarkable. Vascular/Lymphatic: Moderate aortoiliac calcified atheromatous plaque without aneurysm. No abdominal or pelvic adenopathy. Reproductive: Heavily calcified uterine fundal fibroids. No adnexal mass. Other: Small paraumbilical hernia containing only mesenteric fat. no ascites. Bilateral pelvic phleboliths. No free air. Musculoskeletal: Mild multilevel degenerative change in the lower thoracic and lumbar spine. 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 No acute findings. IMPRESSION: 1. No acute findings. 2. Coronary and Aortic Atherosclerosis  (ICD10-170.0). 3. Left femoral head AVN. Electronically Signed   By: Corlis Leak M.D.   On: 06/20/2022 16:11   CT HEAD WO CONTRAST ( )  Result Date: 06/20/2022 CLINICAL DATA:  Mental status change, unknown cause EXAM: CT HEAD WITHOUT CONTRAST TECHNIQUE: Contiguous axial images were obtained from the base of the skull through the vertex without intravenous contrast. RADIATION DOSE REDUCTION: This exam was performed according to the departmental dose-optimization program which includes automated exposure control, adjustment of the mA and/or kV according to patient size and/or use of iterative reconstruction technique. COMPARISON:  None Available. FINDINGS: Brain: No evidence of acute infarction, hemorrhage, hydrocephalus, extra-axial collection or mass lesion/mass effect. Remote high left frontal cortical infarct. Partially empty sella. Vascular: No hyperdense vessel identified. Skull: No acute fracture. Sinuses/Orbits: Clear sinuses.  No acute orbital findings. Other: No mastoid effusions. IMPRESSION: 1. No evidence of acute intracranial abnormality. 2. Remote high left frontal cortical infarct. Electronically Signed   By: Feliberto Harts M.D.   On: 06/20/2022 10:55   DG Chest Port 1 View  Result Date: 06/20/2022 CLINICAL DATA:  Weakness. EXAM: PORTABLE CHEST 1 VIEW COMPARISON:  Chest x-ray July 11, 2008. FINDINGS: Patient rotation. No consolidation. No visible pleural effusions or pneumothorax. Cardiomediastinal silhouette is unchanged. IMPRESSION: No evidence of acute cardiopulmonary disease. Electronically Signed   By: Feliberto Harts M.D.   On: 06/20/2022 09:46    EKG: Independently reviewed. Sinus  rate 93, QTc 422.  No significant ST or T wave abnormalities no prior EKG to compare.  Assessment/Plan Principal Problem:   AKI (acute kidney injury) (HCC) Active Problems:   Fall at home, initial encounter   Stage 3b chronic kidney disease (CKD) (HCC)   Essential hypertension   Stroke  (HCC)   Assessment and Plan: * AKI (acute kidney injury) (HCC) AKI and CKD stage IIIb.  Creatinine elevated at 2.94.  Baseline over the past year has ranged from 1.2-1.5.  Likely prerenal from low-dose diuretic torsemide, in the setting of ARB.  She denies GI losses. -1.5 L bolus given, continue N/s 75cc/hr x 20hrs -Hold home torsemide, losartan, spironolactone  Fall at home, initial encounter Generalized weakness, dizziness and fall in the bathroom.  Head CT unremarkable.  Fall likely secondary to dizziness and dehydration.  With elevated creatinine and lactic acidosis of 3 > 2.6.  Chest x-ray, UA, CT abdomen and pelvis negative for infectious etiology or other etiology.  Leukocytosis of 19.7 unexplained.  Not meeting sepsis criteria. -Cefepime given in ED, vancomycin was started, but stopped due to (received just of 400 mils) complaints of jitteriness and increased blood pressure.   -Hold off on further antibiotics at this time -Follow-up blood cultures -Orthostatic vitals -Trend WBC   Hyperglycemia Glucose 212. -Check A1c  Stroke (HCC) No significant focal deficits appreciated at this time. -Resume  aspirin, statins  Essential hypertension Systolic [id number] to [id number]. -Resume hydralazine,   DVT prophylaxis: Heparin Code Status: Full code, confirmed with patient and daughter Angela Nevin at bedside Family Communication: Daughter Angela Nevin at bedside, son-in-law also at bedside Disposition Plan: ~ 1 - 2 days Consults called:  None  Admission status:  Obs tele  Author: Bethena Roys, MD 06/20/2022 7:14 PM  For on call review [email or web address].

## 2022-06-21 DIAGNOSIS — N179 Acute kidney failure, unspecified: Secondary | ICD-10-CM | POA: Diagnosis not present

## 2022-06-21 LAB — CBC
HCT: 33 % — ABNORMAL LOW (ref 36.0–46.0)
Hemoglobin: 10.6 g/dL — ABNORMAL LOW (ref 12.0–15.0)
MCH: 30.8 pg (ref 26.0–34.0)
MCHC: 32.1 g/dL (ref 30.0–36.0)
MCV: 95.9 fL (ref 80.0–100.0)
Platelets: 165 10*3/uL (ref 150–400)
RBC: 3.44 MIL/uL — ABNORMAL LOW (ref 3.87–5.11)
RDW: 15.7 % — ABNORMAL HIGH (ref 11.5–15.5)
WBC: 11 10*3/uL — ABNORMAL HIGH (ref 4.0–10.5)
nRBC: 0 % (ref 0.0–0.2)

## 2022-06-21 LAB — BASIC METABOLIC PANEL
Anion gap: 6 (ref 5–15)
BUN: 74 mg/dL — ABNORMAL HIGH (ref 8–23)
CO2: 19 mmol/L — ABNORMAL LOW (ref 22–32)
Calcium: 8.9 mg/dL (ref 8.9–10.3)
Chloride: 111 mmol/L (ref 98–111)
Creatinine, Ser: 2.2 mg/dL — ABNORMAL HIGH (ref 0.44–1.00)
GFR, Estimated: 22 mL/min — ABNORMAL LOW (ref >60)
Glucose, Bld: 125 mg/dL — ABNORMAL HIGH (ref 70–99)
Potassium: 5.7 mmol/L — ABNORMAL HIGH (ref 3.5–5.1)
Sodium: 136 mmol/L (ref 135–145)

## 2022-06-21 LAB — HEMOGLOBIN A1C
Hgb A1c MFr Bld: 6.5 % — ABNORMAL HIGH (ref 4.8–5.6)
Mean Plasma Glucose: 139.85 mg/dL

## 2022-06-21 LAB — GLUCOSE, CAPILLARY: Glucose-Capillary: 120 mg/dL — ABNORMAL HIGH (ref 70–99)

## 2022-06-21 MED ORDER — SODIUM ZIRCONIUM CYCLOSILICATE 10 G PO PACK
10.0000 g | PACK | Freq: Two times a day (BID) | ORAL | Status: AC
  Administered 2022-06-21 (×2): 10 g via ORAL
  Filled 2022-06-21 (×2): qty 1

## 2022-06-21 MED ORDER — ISOSORBIDE MONONITRATE ER 60 MG PO TB24
30.0000 mg | ORAL_TABLET | Freq: Every day | ORAL | Status: DC
  Administered 2022-06-21 – 2022-06-23 (×3): 30 mg via ORAL
  Filled 2022-06-21 (×3): qty 1

## 2022-06-21 MED ORDER — INSULIN ASPART 100 UNIT/ML IJ SOLN: 0.0000 [IU] | Freq: Three times a day (TID) | INTRAMUSCULAR | Status: DC

## 2022-06-21 MED ORDER — INSULIN ASPART 100 UNIT/ML IJ SOLN: 0.0000 [IU] | Freq: Every day | INTRAMUSCULAR | Status: DC

## 2022-06-21 NOTE — Care Management Obs Status (Signed)
[facility] NOTIFICATION   Patient Details  Name: Vanessa Davenport MRN: [id number] Date of Birth: [birth year]   Medicare Observation Status Notification Given:  Yes    Tommy Medal 06/21/2022, 4:17 PM

## 2022-06-21 NOTE — Progress Notes (Signed)
  Transition of Care [facility]) Screening Note   Patient Details  Name: Vanessa Davenport Date of Birth: 11-Oct-1940   Transition of Care [facility]) CM/SW Contact:    Ihor Gully, LCSW Phone Number: 06/21/2022, 11:43 AM    Transition of Care Department [facility]) has reviewed patient and no TOC needs have been identified at this time. We will continue to monitor patient advancement through interdisciplinary progression rounds. If new patient transition needs arise, please place a TOC consult.

## 2022-06-21 NOTE — Progress Notes (Signed)
PROGRESS NOTE     Vanessa Davenport, is a 81 y.o. female, DOB - 1940/11/25, [id number]  Admit date - 06/20/2022   Admitting Physician Ejiroghene Arlyce Dice, MD  Outpatient Primary MD for the patient is [name], [name], MD  LOS - 0  Chief Complaint  Patient presents with   Weakness        Brief Narrative:  81 y.o. female with medical history significant for hypertension, glaucoma, stroke admitted on 06/20/2022 with AKI on CKD with generalized weakness dizziness and unwitnessed fall    -Assessment and Plan: 1)AKI----acute kidney injury on CKD stage -3b -Suspect secondary to dehydration  in the setting of diuretic and ARB use -No vomiting no diarrhea -Lactic acidosis most likely due to dehydration --Hold home torsemide, losartan, spironolactone -Improving with hydration  renally adjust medications, avoid nephrotoxic agents / dehydration  / hypotension -  2) hyperkalemia--- potassium trending up in the setting of losartan and Aldactone use PTA, now with AKI on CKD 3B -Lokelma as ordered -Hydrate  3) leukocytosis-suspect this is reactive, WBC trending down -Infection less likely  4)Hyperglycemia -A1c 6.5 -Use Novolog/Humalog Sliding scale insulin with Accu-Cheks/Fingersticks as ordered  Glucose should improve with hydration  5)Stroke (HCC) No significant focal deficits appreciated at this time. Lipitor and aspirin  6)Fall at home, initial encounter -CT head without acute findings -Weakness and fall most likely related to #1  above -Most likely will benefit from home health PT  Essential hypertension -BP is not at goal -Continue hydralazine, and isosorbide -Patient is apparently "allergic" to amlodipine  Disposition/Need for in-Hospital Stay- patient unable to be discharged at this time due to -AKI with dehydration and hyperkalemia requiring IV fluids and interventions for hyperkalemia*  Disposition: The patient is from: Home              Anticipated d/c is to:  Home              Anticipated d/c date is: 1 day              Patient currently is not medically stable to d/c. Barriers: Not Clinically Stable-   Code Status :  -  Code Status: Full Code   Family Communication:   Discussed with patient's daughter at bedside  DVT Prophylaxis  :   - SCDs  heparin injection 5,000 Units Start: 06/20/22 2200   Lab Results  Component Value Date   PLT 165 06/21/2022    Inpatient Medications  Scheduled Meds:  allopurinol  100 mg Oral Daily   aspirin  81 mg Oral Daily   atorvastatin  80 mg Oral Daily   brimonidine  1 drop Both Eyes BID   And   timolol  1 drop Both Eyes BID   dorzolamide  1 drop Both Eyes BID   heparin  5,000 Units Subcutaneous Q8H   hydrALAZINE  100 mg Oral Q12H   latanoprost  1 drop Both Eyes QHS   Netarsudil Dimesylate  1 drop Ophthalmic QHS   sodium zirconium cyclosilicate  10 g Oral BID   Continuous Infusions: PRN Meds:.acetaminophen **OR** acetaminophen, ondansetron **OR** ondansetron (ZOFRAN) IV, polyethylene glycol   Anti-infectives (From admission, onward)    Start     Dose/Rate Route Frequency Ordered Stop   06/22/22 1300  vancomycin (VANCOREADY) IVPB 1500 mg/300 mL  Status:  Discontinued        1,500 mg 150 mL/hr over 120 Minutes Intravenous Every 48 hours 06/20/22 1237 06/20/22 1845   06/21/22 1200  ceFEPIme (MAXIPIME)  2 g in sodium chloride 0.9 % 100 mL IVPB  Status:  Discontinued        2 g 200 mL/hr over 30 Minutes Intravenous Every 24 hours 06/20/22 1237 06/20/22 1845   06/20/22 1245  vancomycin (VANCOREADY) IVPB 2000 mg/400 mL        2,000 mg 200 mL/hr over 120 Minutes Intravenous  Once 06/20/22 1236 06/20/22 1441   06/20/22 1230  ceFEPIme (MAXIPIME) 2 g in sodium chloride 0.9 % 100 mL IVPB        2 g 200 mL/hr over 30 Minutes Intravenous  Once 06/20/22 1224 06/20/22 1355   06/20/22 1230  metroNIDAZOLE (FLAGYL) IVPB 500 mg        500 mg 100 mL/hr over 60 Minutes Intravenous  Once 06/20/22 1224 06/20/22  1420   06/20/22 1230  vancomycin (VANCOCIN) IVPB 1000 mg/200 mL premix  Status:  Discontinued        1,000 mg 200 mL/hr over 60 Minutes Intravenous  Once 06/20/22 1224 06/20/22 1236         Subjective: Rick Duff today has no fevers, no emesis,  No chest pain,   -Voiding well -Oral intake is fair -Patient's daughter is at bedside   Objective: Vitals:   06/20/22 2300 06/21/22 0250 06/21/22 0757 06/21/22 1300  BP:  (!) 133/52 (!) 161/52 (!) 165/71  Pulse:  88 89 90  Resp:  18 19 19   Temp:  98.5 F (36.9 C) 98.6 F (37 C) 97.9 F (36.6 C)  TempSrc:   Oral Oral  SpO2:  100%  100%  Weight: 108.5 kg     Height: 5\' 2"  (1.575 m)       Intake/Output Summary (Last 24 hours) at 06/21/2022 1854 Last data filed at 06/21/2022 1700 Gross per 24 hour  Intake 2110.12 ml  Output 1300 ml  Net 810.12 ml   Filed Weights   06/20/22 0856 06/20/22 2300  Weight: 115 kg 108.5 kg    Physical Exam  Gen:- Awake Alert,  in no apparent distress  HEENT:- [place].AT, No sclera icterus Neck-Supple Neck,No JVD,.  Lungs-  CTAB , fair symmetrical air movement CV- S1, S2 normal, regular  Abd-  +ve B.Sounds, Abd Soft, No tenderness,    Extremity/Skin:- No  edema, pedal pulses present  Psych-affect is appropriate, oriented x3 Neuro-no new focal deficits, no tremors  Data Reviewed: I have personally reviewed following labs and imaging studies  CBC: Recent Labs  Lab 06/20/22 0859 06/21/22 0510  WBC 19.7* 11.0*  HGB 11.7* 10.6*  HCT 36.1 33.0*  MCV 95.8 95.9  PLT 186 026   Basic Metabolic Panel: Recent Labs  Lab 06/20/22 0859 06/21/22 0510  NA 133* 136  K 5.4* 5.7*  CL 104 111  CO2 17* 19*  GLUCOSE 212* 125*  BUN 85* 74*  CREATININE 2.94* 2.20*  CALCIUM 9.5 8.9   GFR: Estimated Creatinine Clearance: 23.3 mL/min (A) (by C-G formula based on SCr of 2.2 mg/dL (H)).  HbA1C: Recent Labs    06/20/22 1401  HGBA1C 6.5*   Recent Results (from the past 240 hour(s))  SARS Coronavirus  2 by RT PCR (hospital order, performed in [facility] hospital lab) *cepheid single result test* Anterior Nasal Swab     Status: None   Collection Time: 06/20/22  9:32 AM   Specimen: Anterior Nasal Swab  Result Value Ref Range Status   SARS Coronavirus 2 by RT PCR NEGATIVE NEGATIVE Final    Comment: (NOTE) SARS-CoV-2 target nucleic acids  are NOT DETECTED.  The SARS-CoV-2 RNA is generally detectable in upper and lower respiratory specimens during the acute phase of infection. The lowest concentration of SARS-CoV-2 viral copies this assay can detect is 250 copies / mL. A negative result does not preclude SARS-CoV-2 infection and should not be used as the sole basis for treatment or other patient management decisions.  A negative result may occur with improper specimen collection / handling, submission of specimen other than nasopharyngeal swab, presence of viral mutation(s) within the areas targeted by this assay, and inadequate number of viral copies (<250 copies / mL). A negative result must be combined with clinical observations, patient history, and epidemiological information.  Fact Sheet for Patients:   https://www.patel.info/  Fact Sheet for Healthcare Providers: https://hall.com/  This test is not yet approved or  cleared by the [place] FDA and has been authorized for detection and/or diagnosis of SARS-CoV-2 by FDA under an Emergency Use Authorization (EUA).  This EUA will remain in effect (meaning this test can be used) for the duration of the COVID-19 declaration under Section 564(b)(1) of the Act, 21 U.S.C. section 360bbb-3(b)(1), unless the authorization is terminated or revoked sooner.  Performed at [facility], [address] [place], [place] [address]       Radiology Studies: CT Abdomen Pelvis Wo Contrast  Result Date: 06/20/2022 CLINICAL DATA:  Acute abdominal pain EXAM: CT ABDOMEN AND PELVIS WITHOUT CONTRAST  TECHNIQUE: Multidetector CT imaging of the abdomen and pelvis was performed following the standard protocol without IV contrast. RADIATION DOSE REDUCTION: This exam was performed according to the departmental dose-optimization program which includes automated exposure control, adjustment of the mA and/or kV according to patient size and/or use of iterative reconstruction technique. COMPARISON:  None Available. FINDINGS: Lower chest: Coronary calcifications. No pleural or pericardial effusion. Visualized lung bases clear. Hepatobiliary: No focal liver lesion or biliary ductal dilatation. Gallbladder decompressed or absent. Pancreas: Unremarkable. No pancreatic ductal dilatation or surrounding inflammatory changes. Spleen: Normal in size without focal abnormality. Adrenals/Urinary Tract: No adrenal mass. No urolithiasis or hydronephrosis. 2.2 cm 1 HU mid left renal parapelvic cyst; no follow-up recommended. The urinary bladder incompletely distended. Stomach/Bowel: Stomach is partially distended, with small hiatal hernia. Small bowel is relatively decompressed with good distal passage of oral contrast material. Normal appendix. The colon is incompletely distended, unremarkable. Vascular/Lymphatic: Moderate aortoiliac calcified atheromatous plaque without aneurysm. No abdominal or pelvic adenopathy. Reproductive: Heavily calcified uterine fundal fibroids. No adnexal mass. Other: Small paraumbilical hernia containing only mesenteric fat. no ascites. Bilateral pelvic phleboliths. No free air. Musculoskeletal: Mild multilevel degenerative change in the lower thoracic and lumbar spine. 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 No acute findings. IMPRESSION: 1. No acute findings. 2. Coronary and Aortic Atherosclerosis (ICD10-170.0). 3. Left femoral head AVN. Electronically Signed   By: Lucrezia Europe M.D.   On: 06/20/2022 16:11   CT HEAD WO CONTRAST (5MM)  Result Date: 06/20/2022 CLINICAL DATA:  Mental status  change, unknown cause EXAM: CT HEAD WITHOUT CONTRAST TECHNIQUE: Contiguous axial images were obtained from the base of the skull through the vertex without intravenous contrast. RADIATION DOSE REDUCTION: This exam was performed according to the departmental dose-optimization program which includes automated exposure control, adjustment of the mA and/or kV according to patient size and/or use of iterative reconstruction technique. COMPARISON:  None Available. FINDINGS: Brain: No evidence of acute infarction, hemorrhage, hydrocephalus, extra-axial collection or mass lesion/mass effect. Remote high left frontal cortical infarct. Partially empty sella. Vascular: No hyperdense  vessel identified. Skull: No acute fracture. Sinuses/Orbits: Clear sinuses.  No acute orbital findings. Other: No mastoid effusions. IMPRESSION: 1. No evidence of acute intracranial abnormality. 2. Remote high left frontal cortical infarct. Electronically Signed   By: Margaretha Sheffield M.D.   On: 06/20/2022 10:55   DG Chest Port 1 View  Result Date: 06/20/2022 CLINICAL DATA:  Weakness. EXAM: PORTABLE CHEST 1 VIEW COMPARISON:  Chest x-ray July 11, 2008. FINDINGS: Patient rotation. No consolidation. No visible pleural effusions or pneumothorax. Cardiomediastinal silhouette is unchanged. IMPRESSION: No evidence of acute cardiopulmonary disease. Electronically Signed   By: Margaretha Sheffield M.D.   On: 06/20/2022 09:46     Scheduled Meds:  allopurinol  100 mg Oral Daily   aspirin  81 mg Oral Daily   atorvastatin  80 mg Oral Daily   brimonidine  1 drop Both Eyes BID   And   timolol  1 drop Both Eyes BID   dorzolamide  1 drop Both Eyes BID   heparin  5,000 Units Subcutaneous Q8H   hydrALAZINE  100 mg Oral Q12H   latanoprost  1 drop Both Eyes QHS   Netarsudil Dimesylate  1 drop Ophthalmic QHS   sodium zirconium cyclosilicate  10 g Oral BID   Continuous Infusions:   LOS: 0 days    Roxan Hockey M.D on 06/21/2022 at 6:54  PM  Go to [email or web address] - for contact info  [facility] - Office  [phone]  If 7PM-7AM, please contact night-coverage [email or web address] 06/21/2022, 6:54 PM

## 2022-06-22 DIAGNOSIS — N179 Acute kidney failure, unspecified: Secondary | ICD-10-CM | POA: Diagnosis not present

## 2022-06-22 LAB — RENAL FUNCTION PANEL
Albumin: 3.3 g/dL — ABNORMAL LOW (ref 3.5–5.0)
Anion gap: 6 (ref 5–15)
BUN: 58 mg/dL — ABNORMAL HIGH (ref 8–23)
CO2: 16 mmol/L — ABNORMAL LOW (ref 22–32)
Calcium: 8.9 mg/dL (ref 8.9–10.3)
Chloride: 114 mmol/L — ABNORMAL HIGH (ref 98–111)
Creatinine, Ser: 1.76 mg/dL — ABNORMAL HIGH (ref 0.44–1.00)
GFR, Estimated: 29 mL/min — ABNORMAL LOW (ref >60)
Glucose, Bld: 121 mg/dL — ABNORMAL HIGH (ref 70–99)
Phosphorus: 3.4 mg/dL (ref 2.5–4.6)
Potassium: 4.9 mmol/L (ref 3.5–5.1)
Sodium: 136 mmol/L (ref 135–145)

## 2022-06-22 LAB — URINALYSIS, ROUTINE W REFLEX MICROSCOPIC
Bilirubin Urine: NEGATIVE
Glucose, UA: NEGATIVE mg/dL
Hgb urine dipstick: NEGATIVE
Ketones, ur: NEGATIVE mg/dL
Leukocytes,Ua: NEGATIVE
Nitrite: NEGATIVE
Protein, ur: NEGATIVE mg/dL
Specific Gravity, Urine: 1.009 (ref 1.005–1.030)
pH: 5 (ref 5.0–8.0)

## 2022-06-22 LAB — GLUCOSE, CAPILLARY
Glucose-Capillary: 120 mg/dL — ABNORMAL HIGH (ref 70–99)
Glucose-Capillary: 125 mg/dL — ABNORMAL HIGH (ref 70–99)
Glucose-Capillary: 158 mg/dL — ABNORMAL HIGH (ref 70–99)
Glucose-Capillary: 123 mg/dL — ABNORMAL HIGH (ref 70–99)

## 2022-06-22 MED ORDER — LORAZEPAM 0.5 MG PO TABS
0.5000 mg | ORAL_TABLET | Freq: Every day | ORAL | Status: DC
  Administered 2022-06-22: 0.5 mg via ORAL
  Filled 2022-06-22: qty 1

## 2022-06-22 MED ORDER — SODIUM ZIRCONIUM CYCLOSILICATE 10 G PO PACK
10.0000 g | PACK | Freq: Once | ORAL | Status: AC
  Administered 2022-06-22: 10 g via ORAL
  Filled 2022-06-22: qty 1

## 2022-06-22 MED ORDER — SODIUM CHLORIDE 0.9 % IV SOLN: INTRAVENOUS | Status: DC

## 2022-06-22 NOTE — Progress Notes (Signed)
Patient refused insulin. Patient states she is not a diabetic. MD Courage Denton Brick notified.

## 2022-06-22 NOTE — Progress Notes (Signed)
PROGRESS NOTE     Vanessa Davenport, is a 81 y.o. female, DOB - 1940/12/16, [id number]  Admit date - 06/20/2022   Admitting Physician Ejiroghene Arlyce Dice, MD  Outpatient Primary MD for the patient is [name], [name], MD  LOS - 0  Chief Complaint  Patient presents with   Weakness        Brief Narrative:  81 y.o. female with medical history significant for hypertension, glaucoma, stroke admitted on 06/20/2022 with AKI on CKD with generalized weakness dizziness and unwitnessed fall    -Assessment and Plan: 1)AKI----acute kidney injury on CKD stage -3b -Suspect secondary to dehydration  in the setting of diuretic and ARB use -No vomiting no diarrhea -Lactic acidosis most likely due to dehydration --Hold home torsemide, losartan, spironolactone -Improving with hydration  renally adjust medications, avoid nephrotoxic agents / dehydration  / hypotension -Poor oral intake  2) hyperkalemia--- potassium trending up in the setting of losartan and Aldactone use PTA, now with AKI on CKD 3B -Resolved with Lokelma -Hydrate  3) leukocytosis-suspect this is reactive, WBC trending down -UA is not consistent with UTI  4)Hyperglycemia -A1c 6.5 -Use Novolog/Humalog Sliding scale insulin with Accu-Cheks/Fingersticks as ordered  Glucose should improve with hydration  5)Stroke (HCC) No significant focal deficits appreciated at this time. Lipitor and aspirin  6)Fall at home, initial encounter -CT head without acute findings -Weakness and fall most likely related to #1  above -Most likely will benefit from home health PT  7)Essential hypertension -BP is not at goal -Continue hydralazine, and isosorbide -Patient is apparently "allergic" to amlodipine  Disposition/Need for in-Hospital Stay- patient unable to be discharged at this time due to -AKI with dehydration and hyperkalemia requiring IV fluids and interventions for hyperkalemia*  Disposition: The patient is from: Home               Anticipated d/c is to: Home              Anticipated d/c date is: 1 day              Patient currently is not medically stable to d/c. Barriers: Not Clinically Stable-   Code Status :  -  Code Status: Full Code   Family Communication:   Discussed with patient's daughter at bedside  DVT Prophylaxis  :   - SCDs  heparin injection 5,000 Units Start: 06/20/22 2200   Lab Results  Component Value Date   PLT 165 06/21/2022    Inpatient Medications  Scheduled Meds:  allopurinol  100 mg Oral Daily   aspirin  81 mg Oral Daily   atorvastatin  80 mg Oral Daily   brimonidine  1 drop Both Eyes BID   And   timolol  1 drop Both Eyes BID   dorzolamide  1 drop Both Eyes BID   heparin  5,000 Units Subcutaneous Q8H   hydrALAZINE  100 mg Oral Q12H   insulin aspart  0-5 Units Subcutaneous QHS   insulin aspart  0-6 Units Subcutaneous TID WC   isosorbide mononitrate  30 mg Oral Daily   latanoprost  1 drop Both Eyes QHS   Netarsudil Dimesylate  1 drop Ophthalmic QHS   Continuous Infusions: PRN Meds:.acetaminophen **OR** acetaminophen, ondansetron **OR** ondansetron (ZOFRAN) IV, polyethylene glycol   Anti-infectives (From admission, onward)    Start     Dose/Rate Route Frequency Ordered Stop   06/22/22 1300  vancomycin (VANCOREADY) IVPB 1500 mg/300 mL  Status:  Discontinued  1,500 mg 150 mL/hr over 120 Minutes Intravenous Every 48 hours 06/20/22 1237 06/20/22 1845   06/21/22 1200  ceFEPIme (MAXIPIME) 2 g in sodium chloride 0.9 % 100 mL IVPB  Status:  Discontinued        2 g 200 mL/hr over 30 Minutes Intravenous Every 24 hours 06/20/22 1237 06/20/22 1845   06/20/22 1245  vancomycin (VANCOREADY) IVPB 2000 mg/400 mL        2,000 mg 200 mL/hr over 120 Minutes Intravenous  Once 06/20/22 1236 06/20/22 1441   06/20/22 1230  ceFEPIme (MAXIPIME) 2 g in sodium chloride 0.9 % 100 mL IVPB        2 g 200 mL/hr over 30 Minutes Intravenous  Once 06/20/22 1224 06/20/22 1355   06/20/22 1230   metroNIDAZOLE (FLAGYL) IVPB 500 mg        500 mg 100 mL/hr over 60 Minutes Intravenous  Once 06/20/22 1224 06/20/22 1420   06/20/22 1230  vancomycin (VANCOCIN) IVPB 1000 mg/200 mL premix  Status:  Discontinued        1,000 mg 200 mL/hr over 60 Minutes Intravenous  Once 06/20/22 1224 06/20/22 1236         Subjective: Rick Duff today has no fevers, no emesis,  No chest pain,   - -Patient's daughter and niece is at bedside -Complaining of dysuria---UA is not consistent with UTI -Oral intake is not great -Confusional episodes overnight did not sleep too well   Objective: Vitals:   06/21/22 1300 06/21/22 2232 06/22/22 0522 06/22/22 1300  BP: (!) 165/71 (!) 161/50 (!) 155/50 (!) 151/53  Pulse: 90 88 85 80  Resp: 19 19 16 18   Temp: 97.9 F (36.6 C)  98.1 F (36.7 C) 98.4 F (36.9 C)  TempSrc: Oral Oral Oral Tympanic  SpO2: 100% 100% 100% 100%  Weight:      Height:        Intake/Output Summary (Last 24 hours) at 06/22/2022 1755 Last data filed at 06/22/2022 1300 Gross per 24 hour  Intake 240 ml  Output 500 ml  Net -260 ml   Filed Weights   06/20/22 0856 06/20/22 2300  Weight: 115 kg 108.5 kg    Physical Exam  Gen:- Awake Alert,  in no apparent distress  HEENT:- [place].AT, No sclera icterus Neck-Supple Neck,No JVD,.  Lungs-  CTAB , fair symmetrical air movement CV- S1, S2 normal, regular  Abd-  +ve B.Sounds, Abd Soft, No tenderness, no CVA area tenderness Extremity/Skin:- No  edema, pedal pulses present  Psych-affect is appropriate, intermittent disorientation and confusion underlying cognitive and memory deficits due to dementia  Neuro-generalized weakness no new focal deficits, no tremors  Data Reviewed: I have personally reviewed following labs and imaging studies  CBC: Recent Labs  Lab 06/20/22 0859 06/21/22 0510  WBC 19.7* 11.0*  HGB 11.7* 10.6*  HCT 36.1 33.0*  MCV 95.8 95.9  PLT 186 657   Basic Metabolic Panel: Recent Labs  Lab 06/20/22 0859  06/21/22 0510 06/22/22 0656  NA 133* 136 136  K 5.4* 5.7* 4.9  CL 104 111 114*  CO2 17* 19* 16*  GLUCOSE 212* 125* 121*  BUN 85* 74* 58*  CREATININE 2.94* 2.20* 1.76*  CALCIUM 9.5 8.9 8.9  PHOS  --   --  3.4   GFR: Estimated Creatinine Clearance: 29.1 mL/min (A) (by C-G formula based on SCr of 1.76 mg/dL (H)).  HbA1C: Recent Labs    06/20/22 1401  HGBA1C 6.5*   Recent Results (from the past  240 hour(s))  SARS Coronavirus 2 by RT PCR (hospital order, performed in [facility] hospital lab) *cepheid single result test* Anterior Nasal Swab     Status: None   Collection Time: 06/20/22  9:32 AM   Specimen: Anterior Nasal Swab  Result Value Ref Range Status   SARS Coronavirus 2 by RT PCR NEGATIVE NEGATIVE Final    Comment: (NOTE) SARS-CoV-2 target nucleic acids are NOT DETECTED.  The SARS-CoV-2 RNA is generally detectable in upper and lower respiratory specimens during the acute phase of infection. The lowest concentration of SARS-CoV-2 viral copies this assay can detect is 250 copies / mL. A negative result does not preclude SARS-CoV-2 infection and should not be used as the sole basis for treatment or other patient management decisions.  A negative result may occur with improper specimen collection / handling, submission of specimen other than nasopharyngeal swab, presence of viral mutation(s) within the areas targeted by this assay, and inadequate number of viral copies (<250 copies / mL). A negative result must be combined with clinical observations, patient history, and epidemiological information.  Fact Sheet for Patients:   https://www.patel.info/  Fact Sheet for Healthcare Providers: https://hall.com/  This test is not yet approved or  cleared by the [place] FDA and has been authorized for detection and/or diagnosis of SARS-CoV-2 by FDA under an Emergency Use Authorization (EUA).  This EUA will remain in effect  (meaning this test can be used) for the duration of the COVID-19 declaration under Section 564(b)(1) of the Act, 21 U.S.C. section 360bbb-3(b)(1), unless the authorization is terminated or revoked sooner.  Performed at [facility], [address] [place], [place] [address]   Blood culture (routine x 2)     Status: None (Preliminary result)   Collection Time: 06/20/22 10:36 AM   Specimen: BLOOD  Result Value Ref Range Status   Specimen Description BLOOD  Final   Special Requests NONE BLOOD LEFT ARM  Final   Culture   Final    NO GROWTH 2 DAYS Performed at [facility], [address], [place], [place] [address]    Report Status PENDING  Incomplete  Blood culture (routine x 2)     Status: None (Preliminary result)   Collection Time: 06/20/22 10:36 AM   Specimen: BLOOD  Result Value Ref Range Status   Specimen Description BLOOD  Final   Special Requests NONE BLOOD RIGHT ARM  Final   Culture   Final    NO GROWTH 2 DAYS Performed at [facility], [address], [place], [place] [address]    Report Status PENDING  Incomplete    Radiology Studies: No results found.   Scheduled Meds:  allopurinol  100 mg Oral Daily   aspirin  81 mg Oral Daily   atorvastatin  80 mg Oral Daily   brimonidine  1 drop Both Eyes BID   And   timolol  1 drop Both Eyes BID   dorzolamide  1 drop Both Eyes BID   heparin  5,000 Units Subcutaneous Q8H   hydrALAZINE  100 mg Oral Q12H   insulin aspart  0-5 Units Subcutaneous QHS   insulin aspart  0-6 Units Subcutaneous TID WC   isosorbide mononitrate  30 mg Oral Daily   latanoprost  1 drop Both Eyes QHS   Netarsudil Dimesylate  1 drop Ophthalmic QHS   Continuous Infusions:   LOS: 0 days   Roxan Hockey M.D on 06/22/2022 at 5:55 PM  Go to [email or web address] - for contact info  [facility] - Office  [phone]  If 7PM-7AM, please contact night-coverage [email or web address] 06/22/2022, 5:55 PM

## 2022-06-22 NOTE — Progress Notes (Signed)
Alert but forgetful and doesn't remember falling at home.  Able to interact in the moment and make appropriate requests , etc.  Stated she had to have a bm and was assisted to [facility] very slowly.  Daughter at bedside and spending the night.

## 2022-06-23 DIAGNOSIS — N179 Acute kidney failure, unspecified: Secondary | ICD-10-CM | POA: Diagnosis not present

## 2022-06-23 LAB — COMPREHENSIVE METABOLIC PANEL
ALT: 14 U/L (ref 0–44)
AST: 24 U/L (ref 15–41)
Albumin: 2.9 g/dL — ABNORMAL LOW (ref 3.5–5.0)
Alkaline Phosphatase: 89 U/L (ref 38–126)
Anion gap: 3 — ABNORMAL LOW (ref 5–15)
BUN: 47 mg/dL — ABNORMAL HIGH (ref 8–23)
CO2: 19 mmol/L — ABNORMAL LOW (ref 22–32)
Calcium: 8.7 mg/dL — ABNORMAL LOW (ref 8.9–10.3)
Chloride: 114 mmol/L — ABNORMAL HIGH (ref 98–111)
Creatinine, Ser: 1.54 mg/dL — ABNORMAL HIGH (ref 0.44–1.00)
GFR, Estimated: 34 mL/min — ABNORMAL LOW (ref >60)
Glucose, Bld: 101 mg/dL — ABNORMAL HIGH (ref 70–99)
Potassium: 4.7 mmol/L (ref 3.5–5.1)
Sodium: 136 mmol/L (ref 135–145)
Total Bilirubin: 0.7 mg/dL (ref 0.3–1.2)
Total Protein: 5.9 g/dL — ABNORMAL LOW (ref 6.5–8.1)

## 2022-06-23 LAB — CBC
HCT: 29.6 % — ABNORMAL LOW (ref 36.0–46.0)
Hemoglobin: 9.3 g/dL — ABNORMAL LOW (ref 12.0–15.0)
MCH: 30.4 pg (ref 26.0–34.0)
MCHC: 31.4 g/dL (ref 30.0–36.0)
MCV: 96.7 fL (ref 80.0–100.0)
Platelets: 140 10*3/uL — ABNORMAL LOW (ref 150–400)
RBC: 3.06 MIL/uL — ABNORMAL LOW (ref 3.87–5.11)
RDW: 15.5 % (ref 11.5–15.5)
WBC: 9.9 10*3/uL (ref 4.0–10.5)
nRBC: 0 % (ref 0.0–0.2)

## 2022-06-23 LAB — GLUCOSE, CAPILLARY: Glucose-Capillary: 99 mg/dL (ref 70–99)

## 2022-06-23 MED ORDER — ISOSORBIDE MONONITRATE ER 30 MG PO TB24: 30.0000 mg | ORAL_TABLET | Freq: Every day | ORAL | 3 refills | Status: DC

## 2022-06-23 MED ORDER — ISOSORBIDE MONONITRATE ER 30 MG PO TB24: 30.0000 mg | ORAL_TABLET | Freq: Every day | ORAL | 3 refills | Status: AC

## 2022-06-23 MED ORDER — HYDRALAZINE HCL 100 MG PO TABS: 100.0000 mg | ORAL_TABLET | Freq: Three times a day (TID) | ORAL | 3 refills | Status: AC

## 2022-06-23 MED ORDER — HYDRALAZINE HCL 100 MG PO TABS: 100.0000 mg | ORAL_TABLET | Freq: Three times a day (TID) | ORAL | 3 refills | Status: DC

## 2022-06-23 NOTE — Discharge Summary (Signed)
Vanessa Davenport, is a 81 y.o. female  DOB 02/06/1941  MRN [id number].  Admission date:  06/20/2022  Admitting Physician  Bethena Roys, MD  Discharge Date:  06/23/2022   Primary MD  Gwynne Edinger, MD  Recommendations for primary care physician for things to follow:   1)Avoid ibuprofen/Advil/Aleve/Motrin/Goody Powders/Naproxen/BC powders/Meloxicam/Diclofenac/Indomethacin and other Nonsteroidal anti-inflammatory medications as these will make you more likely to bleed and can cause stomach ulcers, can also cause Kidney problems.   2)Stop Torsemide/Demadex  3)Repeat BMP blood Test in about 1 week  Admission Diagnosis  Dehydration [E86.0] Generalized weakness [R53.1] AKI (acute kidney injury) ([place]) [N17.9] Elevated lactic acid level [R79.89]   Discharge Diagnosis  Dehydration [E86.0] Generalized weakness [R53.1] AKI (acute kidney injury) ([place]) [N17.9] Elevated lactic acid level [R79.89]  ***  Principal Problem:   AKI (acute kidney injury) ([place]) Active Problems:   Fall at home, initial encounter   Stage 3b chronic kidney disease (CKD) ([place])   Essential hypertension   Stroke ([place])   Hyperglycemia      Past Medical History:  Diagnosis Date   Glaucoma    Hypertension    Stroke [facility])     Past Surgical History:  Procedure Laterality Date   CHOLECYSTECTOMY         HPI  from the history and physical done on the day of admission:   Chief Complaint: Weakness   HPI: Vanessa Davenport is a 81 y.o. female with medical history significant for hypertension, glaucoma, stroke. Patient presented to the ED with complaints of generalized weakness unable to stand this morning.  Upon to today she has been doing well.  Appetite has been good, no vomiting no loose stools.  She reports dizziness this morning, and subsequently falling in the bathroom.  She did not lose consciousness..   Weakness.  Patient's son stays with her.  Patient's daughter Vanessa Davenport and son-in-law at bedside. Patient denies chest pain, denies pain with urination, no fever no chills, no difficulty breathing, no cough.  No abdominal pain.  She is on torsemide 10 mg daily, daughter reports this dose was recently increased from 5 to 10mg .   ED Course: Temperature 97.7.  Heart rate 70s to 80s.  Respiratory rate 80 15-21.  Blood pressure systolic 1 [id number].  O2 sats greater than 95% on room air. Leukocytosis of 19.5.  Lactic acidosis of 3.  Sodium of 133.  Creatinine elevated 2.94.  COVID test negative.  UA not suggestive of infection.  Chest x-ray clear. CT abdomen pelvis without acute abnormality. Broad-spectrum antibiotics started, family became concerned after the vancomycin was started, patient became jittery, and her blood pressure went up.  They are also concerned about the fluids patient was getting. Hospitalist admit for AKI, and possible infection.   Review of Systems: As per HPI all other systems reviewed and negative.     Hospital Course:     No notes on file  ***** Assessment and Plan: * AKI (acute kidney injury) ([place]) AKI and CKD stage IIIb.  Creatinine elevated at 2.94.  Baseline over the past year has ranged from 1.2-1.5.  Likely prerenal from low-dose diuretic torsemide, in the setting of ARB.  She denies GI losses. -1.5 L bolus given, continue N/s 75cc/hr x 20hrs -Hold home torsemide, losartan, spironolactone  Fall at home, initial encounter Generalized weakness, dizziness and fall in the bathroom.  Head CT unremarkable.  Fall likely secondary to dizziness and dehydration.  With elevated creatinine and lactic acidosis of 3 > 2.6.  Chest x-ray, UA, CT abdomen and pelvis negative for infectious etiology or other etiology.  Leukocytosis of 19.7 unexplained.  Not meeting sepsis criteria. -Cefepime given in ED, vancomycin was started, but stopped due to (received just 104mls of 400 mils)  complaints of jitteriness and increased blood pressure.   -Hold off on further antibiotics at this time -Follow-up blood cultures -Orthostatic vitals -Trend WBC   Hyperglycemia Glucose 212. -Check A1c  Stroke (HCC) No significant focal deficits appreciated at this time. -Resume aspirin, statins  Essential hypertension Systolic [id number] to [id number]. -Resume hydralazine,        Discharge Condition: ***  Follow UP     Consults obtained - ***  Diet and Activity recommendation:  As advised  Discharge Instructions    **** Discharge Instructions     Call MD for:  difficulty breathing, headache or visual disturbances   Complete by: As directed    Call MD for:  persistant dizziness or light-headedness   Complete by: As directed    Call MD for:  persistant nausea and vomiting   Complete by: As directed    Call MD for:  temperature >100.4   Complete by: As directed    Diet - low sodium heart healthy   Complete by: As directed    Discharge instructions   Complete by: As directed    1)Avoid ibuprofen/Advil/Aleve/Motrin/Goody Powders/Naproxen/BC powders/Meloxicam/Diclofenac/Indomethacin and other Nonsteroidal anti-inflammatory medications as these will make you more likely to bleed and can cause stomach ulcers, can also cause Kidney problems.   2)Stop Torsemide/Demadex  3)Repeat BMP blood Test in about 1 week   Increase activity slowly   Complete by: As directed          Discharge Medications     Allergies as of 06/23/2022       Reactions   Prednisolone Swelling   Quinapril Swelling   Amlodipine    Thiazide-type Diuretics    Per PCP notes it's a severe reaction    Carvedilol Nausea And Vomiting   Prednisone Rash        Medication List     STOP taking these medications    torsemide 10 MG tablet Commonly known as: DEMADEX       TAKE these medications    allopurinol 100 MG tablet Commonly known as: ZYLOPRIM Take 100 mg by mouth daily.   aspirin  81 MG chewable tablet Chew 81 mg by mouth daily.   atorvastatin 80 MG tablet Commonly known as: LIPITOR Take 80 mg by mouth daily.   Combigan 0.2-0.5 % ophthalmic solution Generic drug: brimonidine-timolol Apply 1 drop to eye 2 (two) times daily.   dorzolamide 2 % ophthalmic solution Commonly known as: TRUSOPT 1 drop 2 (two) times daily.   hydrALAZINE 100 MG tablet Commonly known as: APRESOLINE Take 1 tablet (100 mg total) by mouth 3 (three) times daily. For BP What changed:  when to take this additional instructions   isosorbide mononitrate 30 MG 24 hr tablet Commonly known as: IMDUR Take 1 tablet (30  mg total) by mouth daily. For BP Start taking on: June 24, 2022   losartan 100 MG tablet Commonly known as: COZAAR Take 100 mg by mouth daily.   Lumigan 0.01 % Soln Generic drug: bimatoprost Place 1 drop into both eyes at bedtime.   Rhopressa 0.02 % Soln Generic drug: Netarsudil Dimesylate Apply 1 drop to eye at bedtime.   spironolactone 25 MG tablet Commonly known as: ALDACTONE Take 25 mg by mouth daily.   VITAMIN D PO Take 1 tablet by mouth daily.        Major procedures and Radiology Reports - PLEASE review detailed and final reports for all details, in brief -   ***  CT Abdomen Pelvis Wo Contrast  Result Date: 06/20/2022 CLINICAL DATA:  Acute abdominal pain EXAM: CT ABDOMEN AND PELVIS WITHOUT CONTRAST TECHNIQUE: Multidetector CT imaging of the abdomen and pelvis was performed following the standard protocol without IV contrast. RADIATION DOSE REDUCTION: This exam was performed according to the departmental dose-optimization program which includes automated exposure control, adjustment of the mA and/or kV according to patient size and/or use of iterative reconstruction technique. COMPARISON:  None Available. FINDINGS: Lower chest: Coronary calcifications. No pleural or pericardial effusion. Visualized lung bases clear. Hepatobiliary: No focal liver lesion  or biliary ductal dilatation. Gallbladder decompressed or absent. Pancreas: Unremarkable. No pancreatic ductal dilatation or surrounding inflammatory changes. Spleen: Normal in size without focal abnormality. Adrenals/Urinary Tract: No adrenal mass. No urolithiasis or hydronephrosis. 2.2 cm 1 HU mid left renal parapelvic cyst; no follow-up recommended. The urinary bladder incompletely distended. Stomach/Bowel: Stomach is partially distended, with small hiatal hernia. Small bowel is relatively decompressed with good distal passage of oral contrast material. Normal appendix. The colon is incompletely distended, unremarkable. Vascular/Lymphatic: Moderate aortoiliac calcified atheromatous plaque without aneurysm. No abdominal or pelvic adenopathy. Reproductive: Heavily calcified uterine fundal fibroids. No adnexal mass. Other: Small paraumbilical hernia containing only mesenteric fat. no ascites. Bilateral pelvic phleboliths. No free air. Musculoskeletal: Mild multilevel degenerative change in the lower thoracic and lumbar spine. 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 No acute findings. IMPRESSION: 1. No acute findings. 2. Coronary and Aortic Atherosclerosis (ICD10-170.0). 3. Left femoral head AVN. Electronically Signed   By: Lucrezia Europe M.D.   On: 06/20/2022 16:11   CT HEAD WO CONTRAST (5MM)  Result Date: 06/20/2022 CLINICAL DATA:  Mental status change, unknown cause EXAM: CT HEAD WITHOUT CONTRAST TECHNIQUE: Contiguous axial images were obtained from the base of the skull through the vertex without intravenous contrast. RADIATION DOSE REDUCTION: This exam was performed according to the departmental dose-optimization program which includes automated exposure control, adjustment of the mA and/or kV according to patient size and/or use of iterative reconstruction technique. COMPARISON:  None Available. FINDINGS: Brain: No evidence of acute infarction, hemorrhage, hydrocephalus, extra-axial collection  or mass lesion/mass effect. Remote high left frontal cortical infarct. Partially empty sella. Vascular: No hyperdense vessel identified. Skull: No acute fracture. Sinuses/Orbits: Clear sinuses.  No acute orbital findings. Other: No mastoid effusions. IMPRESSION: 1. No evidence of acute intracranial abnormality. 2. Remote high left frontal cortical infarct. Electronically Signed   By: Margaretha Sheffield M.D.   On: 06/20/2022 10:55   DG Chest Port 1 View  Result Date: 06/20/2022 CLINICAL DATA:  Weakness. EXAM: PORTABLE CHEST 1 VIEW COMPARISON:  Chest x-ray July 11, 2008. FINDINGS: Patient rotation. No consolidation. No visible pleural effusions or pneumothorax. Cardiomediastinal silhouette is unchanged. IMPRESSION: No evidence of acute cardiopulmonary disease. Electronically Signed   By: Roslynn Amble  Ronnald Ramp M.D.   On: 06/20/2022 09:46    Micro Results   *** Recent Results (from the past 240 hour(s))  SARS Coronavirus 2 by RT PCR (hospital order, performed in [facility] hospital lab) *cepheid single result test* Anterior Nasal Swab     Status: None   Collection Time: 06/20/22  9:32 AM   Specimen: Anterior Nasal Swab  Result Value Ref Range Status   SARS Coronavirus 2 by RT PCR NEGATIVE NEGATIVE Final    Comment: (NOTE) SARS-CoV-2 target nucleic acids are NOT DETECTED.  The SARS-CoV-2 RNA is generally detectable in upper and lower respiratory specimens during the acute phase of infection. The lowest concentration of SARS-CoV-2 viral copies this assay can detect is 250 copies / mL. A negative result does not preclude SARS-CoV-2 infection and should not be used as the sole basis for treatment or other patient management decisions.  A negative result may occur with improper specimen collection / handling, submission of specimen other than nasopharyngeal swab, presence of viral mutation(s) within the areas targeted by this assay, and inadequate number of viral copies (<250 copies / mL). A  negative result must be combined with clinical observations, patient history, and epidemiological information.  Fact Sheet for Patients:   https://www.patel.info/  Fact Sheet for Healthcare Providers: https://hall.com/  This test is not yet approved or  cleared by the [place] FDA and has been authorized for detection and/or diagnosis of SARS-CoV-2 by FDA under an Emergency Use Authorization (EUA).  This EUA will remain in effect (meaning this test can be used) for the duration of the COVID-19 declaration under Section 564(b)(1) of the Act, 21 U.S.C. section 360bbb-3(b)(1), unless the authorization is terminated or revoked sooner.  Performed at [facility], [address], [place], [place] [address]   Blood culture (routine x 2)     Status: None (Preliminary result)   Collection Time: 06/20/22 10:36 AM   Specimen: BLOOD  Result Value Ref Range Status   Specimen Description BLOOD  Final   Special Requests NONE BLOOD LEFT ARM  Final   Culture   Final    NO GROWTH 3 DAYS Performed at [facility], [address], [place], [place] [address]    Report Status PENDING  Incomplete  Blood culture (routine x 2)     Status: None (Preliminary result)   Collection Time: 06/20/22 10:36 AM   Specimen: BLOOD  Result Value Ref Range Status   Specimen Description BLOOD  Final   Special Requests NONE BLOOD RIGHT ARM  Final   Culture   Final    NO GROWTH 3 DAYS Performed at [facility], [address], [place], [place] [address]    Report Status PENDING  Incomplete    Today   Subjective    Vanessa Davenport today has no ***          Patient has been seen and examined prior to discharge   Objective   Blood pressure (!) 146/50, pulse 71, temperature 98.6 F (37 C), resp. rate 17, height 5\' 2"  (1.575 m), weight 108.5 kg, SpO2 100 %.   Intake/Output Summary (Last 24 hours) at 06/23/2022 1357 Last data filed at 06/23/2022 0900 Gross per 24 hour   Intake 686.23 ml  Output 1300 ml  Net -613.77 ml    Exam Gen:- Awake Alert, no acute distress *** HEENT:- [place].AT, No sclera icterus Neck-Supple Neck,No JVD,.  Lungs-  CTAB , good air movement bilaterally CV- S1, S2 normal, regular Abd-  +ve B.Sounds,  Abd Soft, No tenderness,    Extremity/Skin:- No  edema,   good pulses Psych-affect is appropriate, oriented x3 Neuro-no new focal deficits, no tremors ***   Data Review   CBC w Diff:  Lab Results  Component Value Date   WBC 9.9 06/23/2022   HGB 9.3 (L) 06/23/2022   HCT 29.6 (L) 06/23/2022   PLT 140 (L) 06/23/2022    CMP:  Lab Results  Component Value Date   NA 136 06/23/2022   K 4.7 06/23/2022   CL 114 (H) 06/23/2022   CO2 19 (L) 06/23/2022   BUN 47 (H) 06/23/2022   CREATININE 1.54 (H) 06/23/2022   PROT 5.9 (L) 06/23/2022   ALBUMIN 2.9 (L) 06/23/2022   BILITOT 0.7 06/23/2022   ALKPHOS 89 06/23/2022   AST 24 06/23/2022   ALT 14 06/23/2022  .  Total Discharge time is about 33 minutes  Roxan Hockey M.D on 06/23/2022 at 1:57 PM  Go to [email or web address] -  for contact info  [facility] - Office  [phone]

## 2022-06-23 NOTE — Discharge Instructions (Signed)
1)Avoid ibuprofen/Advil/Aleve/Motrin/Goody Powders/Naproxen/BC powders/Meloxicam/Diclofenac/Indomethacin and other Nonsteroidal anti-inflammatory medications as these will make you more likely to bleed and can cause stomach ulcers, can also cause Kidney problems.   2)Stop Torsemide/Demadex  3)Repeat BMP blood Test in about 1 week

## 2022-06-23 NOTE — Progress Notes (Addendum)
TOC CSW attempted to find a [facility] agency near [place], [place], and [place] area.  The following facilities were contacted, but no answer.     [place] [phone]  [place] [phone]  [place] [phone]  In accordance with Medicare.gov  Passenger transport manager, MSW, LCSW-A Pronouns:  She/Her/Hers [facility] HealthTransitions of Care Clinical Social Worker Direct Number:  [phone] [email or web address]

## 2022-06-24 LAB — GLUCOSE, CAPILLARY: Glucose-Capillary: 157 mg/dL — ABNORMAL HIGH (ref 70–99)

## 2022-06-25 LAB — URINE CULTURE: Culture: 20000 — AB

## 2022-06-25 LAB — CULTURE, BLOOD (ROUTINE X 2)
Culture: NO GROWTH
Culture: NO GROWTH
